# Patient Record
Sex: Male | Born: 1970 | Race: White | Hispanic: No | Marital: Single | Smoking: Former smoker
Health system: Southern US, Community
[De-identification: ages and names within clinical notes are randomized; demographics above are authoritative.]

## PROBLEM LIST (undated history)

## (undated) DIAGNOSIS — R519 Headache, unspecified: Secondary | ICD-10-CM

## (undated) DIAGNOSIS — E109 Type 1 diabetes mellitus without complications: Secondary | ICD-10-CM

## (undated) DIAGNOSIS — I10 Essential (primary) hypertension: Secondary | ICD-10-CM

## (undated) DIAGNOSIS — I2510 Atherosclerotic heart disease of native coronary artery without angina pectoris: Secondary | ICD-10-CM

## (undated) DIAGNOSIS — E119 Type 2 diabetes mellitus without complications: Secondary | ICD-10-CM

## (undated) DIAGNOSIS — N183 Chronic kidney disease, stage 3 unspecified: Secondary | ICD-10-CM

## (undated) DIAGNOSIS — W3400XA Accidental discharge from unspecified firearms or gun, initial encounter: Secondary | ICD-10-CM

## (undated) DIAGNOSIS — R51 Headache: Secondary | ICD-10-CM

## (undated) DIAGNOSIS — E1110 Type 2 diabetes mellitus with ketoacidosis without coma: Secondary | ICD-10-CM

## (undated) DIAGNOSIS — F603 Borderline personality disorder: Secondary | ICD-10-CM

## (undated) DIAGNOSIS — F4310 Post-traumatic stress disorder, unspecified: Secondary | ICD-10-CM

## (undated) HISTORY — DX: Type 1 diabetes mellitus without complications: E10.9

## (undated) HISTORY — PX: ANKLE SURGERY: SHX546

---

## 1998-09-30 ENCOUNTER — Emergency Department (HOSPITAL_COMMUNITY): Admission: EM | Admit: 1998-09-30 | Discharge: 1998-09-30 | Payer: Self-pay | Admitting: Emergency Medicine

## 2010-01-30 ENCOUNTER — Inpatient Hospital Stay (HOSPITAL_COMMUNITY): Admission: EM | Admit: 2010-01-30 | Discharge: 2010-02-04 | Payer: Self-pay | Admitting: Emergency Medicine

## 2010-09-02 LAB — CBC
HCT: 31.5 % — ABNORMAL LOW (ref 39.0–52.0)
HCT: 31.9 % — ABNORMAL LOW (ref 39.0–52.0)
MCHC: 34.7 g/dL (ref 30.0–36.0)
MCV: 92.4 fL (ref 78.0–100.0)
MCV: 90.6 fL (ref 78.0–100.0)
Platelets: 220 10*3/uL (ref 150–400)
Platelets: 233 10*3/uL (ref 150–400)
RBC: 3.42 MIL/uL — ABNORMAL LOW (ref 4.22–5.81)
RDW: 15.3 % (ref 11.5–15.5)
RDW: 13.5 % (ref 11.5–15.5)
WBC: 5.6 10*3/uL (ref 4.0–10.5)
WBC: 9.1 10*3/uL (ref 4.0–10.5)

## 2010-09-02 LAB — POCT I-STAT, CHEM 8
BUN: 21 mg/dL (ref 6–23)
Chloride: 105 mEq/L (ref 96–112)
Creatinine, Ser: 1.5 mg/dL (ref 0.4–1.5)
Glucose, Bld: 105 mg/dL — ABNORMAL HIGH (ref 70–99)
Hemoglobin: 10.9 g/dL — ABNORMAL LOW (ref 13.0–17.0)
Potassium: 3.1 mEq/L — ABNORMAL LOW (ref 3.5–5.1)
Sodium: 141 mEq/L (ref 135–145)

## 2010-09-02 LAB — GLUCOSE, CAPILLARY
Glucose-Capillary: 133 mg/dL — ABNORMAL HIGH (ref 70–99)
Glucose-Capillary: 148 mg/dL — ABNORMAL HIGH (ref 70–99)
Glucose-Capillary: 152 mg/dL — ABNORMAL HIGH (ref 70–99)
Glucose-Capillary: 206 mg/dL — ABNORMAL HIGH (ref 70–99)
Glucose-Capillary: 218 mg/dL — ABNORMAL HIGH (ref 70–99)
Glucose-Capillary: 284 mg/dL — ABNORMAL HIGH (ref 70–99)
Glucose-Capillary: 329 mg/dL — ABNORMAL HIGH (ref 70–99)
Glucose-Capillary: 343 mg/dL — ABNORMAL HIGH (ref 70–99)
Glucose-Capillary: 350 mg/dL — ABNORMAL HIGH (ref 70–99)
Glucose-Capillary: 419 mg/dL — ABNORMAL HIGH (ref 70–99)
Glucose-Capillary: 102 mg/dL — ABNORMAL HIGH (ref 70–99)
Glucose-Capillary: 135 mg/dL — ABNORMAL HIGH (ref 70–99)
Glucose-Capillary: 145 mg/dL — ABNORMAL HIGH (ref 70–99)
Glucose-Capillary: 150 mg/dL — ABNORMAL HIGH (ref 70–99)
Glucose-Capillary: 158 mg/dL — ABNORMAL HIGH (ref 70–99)
Glucose-Capillary: 179 mg/dL — ABNORMAL HIGH (ref 70–99)
Glucose-Capillary: 254 mg/dL — ABNORMAL HIGH (ref 70–99)
Glucose-Capillary: 317 mg/dL — ABNORMAL HIGH (ref 70–99)
Glucose-Capillary: 345 mg/dL — ABNORMAL HIGH (ref 70–99)
Glucose-Capillary: 446 mg/dL — ABNORMAL HIGH (ref 70–99)
Glucose-Capillary: 56 mg/dL — ABNORMAL LOW (ref 70–99)
Glucose-Capillary: 61 mg/dL — ABNORMAL LOW (ref 70–99)
Glucose-Capillary: 64 mg/dL — ABNORMAL LOW (ref 70–99)
Glucose-Capillary: 75 mg/dL (ref 70–99)
Glucose-Capillary: 87 mg/dL (ref 70–99)

## 2010-09-02 LAB — BASIC METABOLIC PANEL
BUN: 18 mg/dL (ref 6–23)
BUN: 30 mg/dL — ABNORMAL HIGH (ref 6–23)
CO2: 27 mEq/L (ref 19–32)
CO2: 27 mEq/L (ref 19–32)
CO2: 28 mEq/L (ref 19–32)
Calcium: 9.5 mg/dL (ref 8.4–10.5)
Calcium: 9.5 mg/dL (ref 8.4–10.5)
Calcium: 9.7 mg/dL (ref 8.4–10.5)
Chloride: 107 mEq/L (ref 96–112)
Creatinine, Ser: 1.27 mg/dL (ref 0.4–1.5)
GFR calc Af Amer: >60 mL/min (ref >60)
GFR calc Af Amer: >60 mL/min (ref >60)
GFR calc Af Amer: >60 mL/min (ref >60)
GFR calc Af Amer: >60 mL/min (ref >60)
GFR calc non Af Amer: >60 mL/min (ref >60)
GFR calc non Af Amer: >60 mL/min (ref >60)
GFR calc non Af Amer: >60 mL/min (ref >60)
Glucose, Bld: 176 mg/dL — ABNORMAL HIGH (ref 70–99)
Glucose, Bld: 285 mg/dL — ABNORMAL HIGH (ref 70–99)
Potassium: 4.1 mEq/L (ref 3.5–5.1)
Potassium: 4.2 mEq/L (ref 3.5–5.1)
Potassium: 4.3 mEq/L (ref 3.5–5.1)
Sodium: 136 mEq/L (ref 135–145)
Sodium: 137 mEq/L (ref 135–145)
Sodium: 139 mEq/L (ref 135–145)

## 2010-09-02 LAB — DIFFERENTIAL
Basophils Absolute: 0 10*3/uL (ref 0.0–0.1)
Basophils Relative: 0 % (ref 0–1)
Eosinophils Absolute: 0 10*3/uL (ref 0.0–0.7)
Eosinophils Relative: 1 % (ref 0–5)
Lymphocytes Relative: 10 % — ABNORMAL LOW (ref 12–46)
Monocytes Absolute: 0.3 10*3/uL (ref 0.1–1.0)

## 2010-09-02 LAB — URINALYSIS, ROUTINE W REFLEX MICROSCOPIC
Bilirubin Urine: NEGATIVE
Glucose, UA: 250 mg/dL — AB
Ketones, ur: NEGATIVE mg/dL
Nitrite: NEGATIVE
Specific Gravity, Urine: 1.012 (ref 1.005–1.030)
pH: 6 (ref 5.0–8.0)

## 2010-09-02 LAB — CK TOTAL AND CKMB (NOT AT ARMC): Total CK: 148 U/L (ref 7–232)

## 2012-09-18 ENCOUNTER — Emergency Department (HOSPITAL_COMMUNITY)
Admission: EM | Admit: 2012-09-18 | Discharge: 2012-09-18 | Disposition: A | Discharge status: Home / Self Care | Payer: Medicare Other | Attending: Emergency Medicine | Admitting: Emergency Medicine

## 2012-09-18 ENCOUNTER — Encounter (HOSPITAL_COMMUNITY): Payer: Self-pay | Admitting: *Deleted

## 2012-09-18 DIAGNOSIS — K089 Disorder of teeth and supporting structures, unspecified: Secondary | ICD-10-CM | POA: Insufficient documentation

## 2012-09-18 DIAGNOSIS — Z87891 Personal history of nicotine dependence: Secondary | ICD-10-CM | POA: Insufficient documentation

## 2012-09-18 DIAGNOSIS — Z794 Long term (current) use of insulin: Secondary | ICD-10-CM | POA: Insufficient documentation

## 2012-09-18 DIAGNOSIS — R51 Headache: Secondary | ICD-10-CM | POA: Insufficient documentation

## 2012-09-18 DIAGNOSIS — I252 Old myocardial infarction: Secondary | ICD-10-CM | POA: Insufficient documentation

## 2012-09-18 DIAGNOSIS — E1110 Type 2 diabetes mellitus with ketoacidosis without coma: Secondary | ICD-10-CM | POA: Insufficient documentation

## 2012-09-18 DIAGNOSIS — R079 Chest pain, unspecified: Secondary | ICD-10-CM | POA: Insufficient documentation

## 2012-09-18 DIAGNOSIS — Z87828 Personal history of other (healed) physical injury and trauma: Secondary | ICD-10-CM | POA: Insufficient documentation

## 2012-09-18 DIAGNOSIS — Z9861 Coronary angioplasty status: Secondary | ICD-10-CM | POA: Insufficient documentation

## 2012-09-18 DIAGNOSIS — Z79899 Other long term (current) drug therapy: Secondary | ICD-10-CM | POA: Insufficient documentation

## 2012-09-18 DIAGNOSIS — I10 Essential (primary) hypertension: Secondary | ICD-10-CM | POA: Insufficient documentation

## 2012-09-18 DIAGNOSIS — K0889 Other specified disorders of teeth and supporting structures: Secondary | ICD-10-CM

## 2012-09-18 LAB — GLUCOSE, CAPILLARY: Glucose-Capillary: 171 mg/dL — ABNORMAL HIGH (ref 70–99)

## 2012-09-18 MED ORDER — IBUPROFEN 400 MG PO TABS
600.0000 mg | ORAL_TABLET | Freq: Once | ORAL | Status: AC
  Administered 2012-09-18: 600 mg via ORAL
  Filled 2012-09-18: qty 2
  Filled 2012-09-18: qty 1

## 2012-09-18 MED ORDER — OXYCODONE-ACETAMINOPHEN 5-325 MG PO TABS: 1.0000 | ORAL_TABLET | ORAL | Status: DC | PRN

## 2012-09-18 MED ORDER — BUPIVACAINE HCL 0.5 % IJ SOLN: 50.0000 mL | Freq: Once | INTRAMUSCULAR | Status: DC

## 2012-09-18 MED ORDER — OXYCODONE-ACETAMINOPHEN 5-325 MG PO TABS
2.0000 | ORAL_TABLET | Freq: Once | ORAL | Status: AC
  Administered 2012-09-18: 2 via ORAL
  Filled 2012-09-18: qty 2

## 2012-09-18 MED ORDER — BUPIVACAINE HCL (PF) 0.5 % IJ SOLN
10.0000 mL | Freq: Once | INTRAMUSCULAR | Status: DC
  Filled 2012-09-18: qty 30

## 2012-09-18 MED ORDER — IBUPROFEN 600 MG PO TABS: 600.0000 mg | ORAL_TABLET | Freq: Four times a day (QID) | ORAL | Status: DC | PRN

## 2012-09-18 NOTE — ED Notes (Signed)
Patient's blood sugar rechecked. Patient now c/o severe headache, nausea, and chest pain. 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 Charge nurse made aware. EKG done.

## 2012-09-18 NOTE — ED Notes (Signed)
Pt with left dental pain since Friday and is unable to see dentist anytime soon, states CBG's in high 300's today and has an insulin pump, hx of MI as well per pt

## 2012-09-24 NOTE — ED Provider Notes (Signed)
History    41yM with dental pain. Gradual onset Friday. Constant. Stable since onset. No drainable. No fever or chills. No difficulty breathing or swallowing. Has been taking nsaids with only mild relief. Reports has dental follow-up.   CSN: [id number]  Arrival date & time 09/18/12  1532   First MD Initiated Contact with Patient 09/18/12 1654      Chief Complaint  Patient presents with  . Dental Pain  . Chest Pain  . Headache    (Consider location/radiation/quality/duration/timing/severity/associated sxs/prior treatment) HPI  Past Medical History  Diagnosis Date  . MI, old   . Fx ankle   . Diabetes mellitus without complication   . Hypertension   . DKA (diabetic ketoacidoses)     Past Surgical History  Procedure Laterality Date  . Coronary angioplasty with stent placement      History reviewed. No pertinent family history.  History  Substance Use Topics  . Smoking status: Former Games developer  . Smokeless tobacco: Not on file  . Alcohol Use: Yes     Comment: occasional use      Review of Systems  All systems reviewed and negative, other than as noted in HPI.   Allergies  Review of patient's allergies indicates no known allergies.  Home Medications   Current Outpatient Rx  Name  Route  Sig  Dispense  Refill  . acetaminophen (TYLENOL) 500 MG tablet   Oral   Take 1,000 mg by mouth once as needed for pain.         Marland Kitchen amLODipine (NORVASC) 10 MG tablet   Oral   Take 10 mg by mouth daily.         . cholecalciferol (VITAMIN D) 1000 UNITS tablet   Oral   Take 1,000 Units by mouth daily.         . fish oil-omega-3 fatty acids 1000 MG capsule   Oral   Take 2 g by mouth 2 (two) times daily.         . hydroxypropyl methylcellulose (ISOPTO TEARS) 2.5 % ophthalmic solution   Both Eyes   Place 1 drop into both eyes 4 (four) times daily.         . insulin aspart (NOVOLOG) 100 UNIT/ML injection   Subcutaneous   Inject into the skin continuous. Decarb  Ratio= 13grams:1unit  Insulin Sensitivity=38:1unit Basal= 0.775/hr         . lisinopril (PRINIVIL,ZESTRIL) 40 MG tablet   Oral   Take 40 mg by mouth daily.         . Magnesium Oxide 420 MG TABS   Oral   Take 1 tablet by mouth daily.         . metoprolol (LOPRESSOR) 50 MG tablet   Oral   Take 25 mg by mouth 2 (two) times daily.         Marland Kitchen PARoxetine (PAXIL) 40 MG tablet   Oral   Take 60 mg by mouth every morning.         . simvastatin (ZOCOR) 20 MG tablet   Oral   Take 10 mg by mouth every evening.         . traZODone (DESYREL) 100 MG tablet   Oral   Take 100 mg by mouth at bedtime as needed for sleep.         Marland Kitchen zolpidem (AMBIEN) 10 MG tablet   Oral   Take 10 mg by mouth at bedtime.         Marland Kitchen ibuprofen (ADVIL,MOTRIN)  600 MG tablet   Oral   Take 1 tablet (600 mg total) by mouth every 6 (six) hours as needed for pain.   30 tablet   30   . oxyCODONE-acetaminophen (PERCOCET/ROXICET) 5-325 MG per tablet   Oral   Take 1-2 tablets by mouth every 4 (four) hours as needed for pain.   20 tablet   0     BP 170/92  Pulse 73  Temp(Src) 98 F (36.7 C) (Oral)  Resp 18  Ht 5\' 6"  (1.676 m)  Wt 140 lb (63.504 kg)  BMI 22.61 kg/m2  SpO2 100%  Physical Exam  Nursing note and vitals reviewed. Constitutional: He appears well-developed and well-nourished. No distress.  HENT:  Head: Normocephalic and atraumatic.  Evidence of previous dental work to L upper second molar. No significant gingival changes. No drainable collection. Posterior pharynx clear. uvula midline. Handling secretions. Normal sounding phonation. Neck supple. No adenopathy.   Eyes: Conjunctivae are normal. Right eye exhibits no discharge. Left eye exhibits no discharge.  Neck: Neck supple.  Cardiovascular: Normal rate, regular rhythm and normal heart sounds.  Exam reveals no gallop and no friction rub.   No murmur heard. Pulmonary/Chest: Effort normal and breath sounds normal. No respiratory  distress.  Abdominal: Soft. He exhibits no distension. There is no tenderness.  Musculoskeletal: He exhibits no edema and no tenderness.  Neurological: He is alert.  Skin: Skin is warm and dry.  Psychiatric: He has a normal mood and affect. His behavior is normal. Thought content normal.    ED Course  NERVE BLOCK Date/Time: 09/18/2012 3:29 PM Performed by: Raeford Razor Authorized by: Raeford Razor Consent: Verbal consent obtained. Risks and benefits: risks, benefits and alternatives were discussed Patient identity confirmed: verbally with patient, arm band and provided demographic data Indications: pain relief Body area: face/mouth Laterality: left Patient sedated: no Patient position: sitting Needle gauge: 27 G Location technique: anatomical landmarks Local anesthetic: bupivacaine 0.5% without epinephrine Anesthetic total: 1.5 ml Outcome: pain improved Patient tolerance: Patient tolerated the procedure well with no immediate complications. Comments: L upper molar injected at apex    (including critical care time)  Labs Reviewed  GLUCOSE, CAPILLARY - Abnormal; Notable for the following:    Glucose-Capillary 171 (*)    All other components within normal limits   No results found.   1. Pain, dental       MDM  41yM with dental pain. No drainable collection or evidence of serious deep space head/neck infection. Dental block with good resultant analgesia. Outpt dental FU.         Raeford Razor, MD 09/24/12 [phone]

## 2012-12-28 ENCOUNTER — Emergency Department (HOSPITAL_COMMUNITY)
Admission: EM | Admit: 2012-12-28 | Discharge: 2012-12-28 | Disposition: A | Discharge status: Home / Self Care | Payer: Non-veteran care | Attending: Emergency Medicine | Admitting: Emergency Medicine

## 2012-12-28 ENCOUNTER — Encounter (HOSPITAL_COMMUNITY): Payer: Self-pay | Admitting: Emergency Medicine

## 2012-12-28 DIAGNOSIS — M79609 Pain in unspecified limb: Secondary | ICD-10-CM | POA: Insufficient documentation

## 2012-12-28 DIAGNOSIS — E1110 Type 2 diabetes mellitus with ketoacidosis without coma: Secondary | ICD-10-CM | POA: Insufficient documentation

## 2012-12-28 DIAGNOSIS — I252 Old myocardial infarction: Secondary | ICD-10-CM | POA: Insufficient documentation

## 2012-12-28 DIAGNOSIS — M25579 Pain in unspecified ankle and joints of unspecified foot: Secondary | ICD-10-CM | POA: Insufficient documentation

## 2012-12-28 DIAGNOSIS — Z87891 Personal history of nicotine dependence: Secondary | ICD-10-CM | POA: Insufficient documentation

## 2012-12-28 DIAGNOSIS — Z8781 Personal history of (healed) traumatic fracture: Secondary | ICD-10-CM | POA: Insufficient documentation

## 2012-12-28 DIAGNOSIS — Z9861 Coronary angioplasty status: Secondary | ICD-10-CM | POA: Insufficient documentation

## 2012-12-28 DIAGNOSIS — I10 Essential (primary) hypertension: Secondary | ICD-10-CM | POA: Insufficient documentation

## 2012-12-28 DIAGNOSIS — Z79899 Other long term (current) drug therapy: Secondary | ICD-10-CM | POA: Insufficient documentation

## 2012-12-28 DIAGNOSIS — Z794 Long term (current) use of insulin: Secondary | ICD-10-CM | POA: Insufficient documentation

## 2012-12-28 DIAGNOSIS — M25572 Pain in left ankle and joints of left foot: Secondary | ICD-10-CM

## 2012-12-28 DIAGNOSIS — M79672 Pain in left foot: Secondary | ICD-10-CM

## 2012-12-28 DIAGNOSIS — E162 Hypoglycemia, unspecified: Secondary | ICD-10-CM

## 2012-12-28 DIAGNOSIS — E1169 Type 2 diabetes mellitus with other specified complication: Secondary | ICD-10-CM | POA: Insufficient documentation

## 2012-12-28 HISTORY — DX: Accidental discharge from unspecified firearms or gun, initial encounter: W34.00XA

## 2012-12-28 LAB — GLUCOSE, CAPILLARY
Glucose-Capillary: 32 mg/dL — CL (ref 70–99)
Glucose-Capillary: 189 mg/dL — ABNORMAL HIGH (ref 70–99)

## 2012-12-28 MED ORDER — DEXTROSE 50 % IV SOLN
INTRAVENOUS | Status: AC
  Filled 2012-12-28: qty 50

## 2012-12-28 MED ORDER — NAPROXEN 500 MG PO TABS: 500.0000 mg | ORAL_TABLET | Freq: Two times a day (BID) | ORAL | Status: DC

## 2012-12-28 MED ORDER — NAPROXEN 250 MG PO TABS
500.0000 mg | ORAL_TABLET | Freq: Once | ORAL | Status: AC
  Administered 2012-12-28: 500 mg via ORAL
  Filled 2012-12-28: qty 2

## 2012-12-28 MED ORDER — DEXTROSE 50 % IV SOLN
1.0000 | Freq: Once | INTRAVENOUS | Status: AC
  Administered 2012-12-28: 50 mL via INTRAVENOUS

## 2012-12-28 NOTE — ED Notes (Signed)
Patient complaining of sever left ankle and left foot pain x 2 days. Reports no relief with Motrin. Denies injury.

## 2012-12-28 NOTE — ED Notes (Signed)
Blood glucose checked after administration of D50. CBG read "189". Patient reports he feels better at this time. Patient alert and oriented with clear speech. Dr. Colon Branch notified. Patient drinking soda and eating peanut butter crackers at this time.

## 2012-12-28 NOTE — ED Notes (Signed)
Dr. Colon Branch notified of patient's repeat blood glucose.

## 2012-12-28 NOTE — ED Notes (Signed)
Patient called out on call bell and stated that he felt as if his blood sugar was dropping and wanted his blood sugar checked. Patient here with only complaint of ankle/foot pain. Patient has history of DM with insulin pump. Checked patient's CBG and blood glucose read "32". Patient then began slurring words and was diaphoretic. Dr. Colon Branch notified. Patient drank 2 cups of orange juice. IV then started and D50 administered. Shortly after administration of D50, patient began acting normal self again and stated he felt better.
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
## 2012-12-28 NOTE — ED Notes (Signed)
Patient discharged home with instructions to follow up with MD regarding hypoglycemia. Patient verbalized understanding. Patient also instructed to check blood glucose prior to going to sleep once he gets home. No acute distress noted on discharge.

## 2013-01-15 DIAGNOSIS — R079 Chest pain, unspecified: Secondary | ICD-10-CM

## 2013-05-13 ENCOUNTER — Emergency Department (HOSPITAL_COMMUNITY)
Admission: EM | Admit: 2013-05-13 | Discharge: 2013-05-14 | Disposition: A | Discharge status: Home / Self Care | Payer: Medicare Other | Attending: Emergency Medicine | Admitting: Emergency Medicine

## 2013-05-13 ENCOUNTER — Encounter (HOSPITAL_COMMUNITY): Payer: Self-pay | Admitting: Emergency Medicine

## 2013-05-13 DIAGNOSIS — I10 Essential (primary) hypertension: Secondary | ICD-10-CM | POA: Insufficient documentation

## 2013-05-13 DIAGNOSIS — Z794 Long term (current) use of insulin: Secondary | ICD-10-CM | POA: Insufficient documentation

## 2013-05-13 DIAGNOSIS — K529 Noninfective gastroenteritis and colitis, unspecified: Secondary | ICD-10-CM

## 2013-05-13 DIAGNOSIS — K5289 Other specified noninfective gastroenteritis and colitis: Secondary | ICD-10-CM | POA: Insufficient documentation

## 2013-05-13 DIAGNOSIS — I252 Old myocardial infarction: Secondary | ICD-10-CM | POA: Insufficient documentation

## 2013-05-13 DIAGNOSIS — E119 Type 2 diabetes mellitus without complications: Secondary | ICD-10-CM | POA: Insufficient documentation

## 2013-05-13 DIAGNOSIS — Z79899 Other long term (current) drug therapy: Secondary | ICD-10-CM | POA: Insufficient documentation

## 2013-05-13 DIAGNOSIS — R51 Headache: Secondary | ICD-10-CM | POA: Insufficient documentation

## 2013-05-13 DIAGNOSIS — Z87891 Personal history of nicotine dependence: Secondary | ICD-10-CM | POA: Insufficient documentation

## 2013-05-13 DIAGNOSIS — Z9861 Coronary angioplasty status: Secondary | ICD-10-CM | POA: Insufficient documentation

## 2013-05-13 DIAGNOSIS — Z87828 Personal history of other (healed) physical injury and trauma: Secondary | ICD-10-CM | POA: Insufficient documentation

## 2013-05-13 DIAGNOSIS — Z8781 Personal history of (healed) traumatic fracture: Secondary | ICD-10-CM | POA: Insufficient documentation

## 2013-05-13 LAB — CBC WITH DIFFERENTIAL/PLATELET
Basophils Absolute: 0.1 10*3/uL (ref 0.0–0.1)
Basophils Relative: 1 % (ref 0–1)
Eosinophils Absolute: 0 10*3/uL (ref 0.0–0.7)
Hemoglobin: 14.6 g/dL (ref 13.0–17.0)
MCH: 33.3 pg (ref 26.0–34.0)
MCHC: 36.1 g/dL — ABNORMAL HIGH (ref 30.0–36.0)
Neutro Abs: 6.4 10*3/uL (ref 1.7–7.7)
Neutrophils Relative %: 75 % (ref 43–77)
Platelets: 216 10*3/uL (ref 150–400)
RDW: 12.1 % (ref 11.5–15.5)

## 2013-05-13 LAB — TROPONIN I: Troponin I: <0.3 ng/mL (ref <0.30)

## 2013-05-13 LAB — BASIC METABOLIC PANEL
BUN: 24 mg/dL — ABNORMAL HIGH (ref 6–23)
Calcium: 9.6 mg/dL (ref 8.4–10.5)
Creatinine, Ser: 1.64 mg/dL — ABNORMAL HIGH (ref 0.50–1.35)
GFR calc Af Amer: 58 mL/min — ABNORMAL LOW (ref >90)
GFR calc non Af Amer: 50 mL/min — ABNORMAL LOW (ref >90)
Potassium: 3.7 mEq/L (ref 3.5–5.1)

## 2013-05-13 LAB — GLUCOSE, CAPILLARY: Glucose-Capillary: 191 mg/dL — ABNORMAL HIGH (ref 70–99)

## 2013-05-13 MED ORDER — SODIUM CHLORIDE 0.9 % IV BOLUS (SEPSIS)
1000.0000 mL | Freq: Once | INTRAVENOUS | Status: AC
  Administered 2013-05-13: 1000 mL via INTRAVENOUS

## 2013-05-13 MED ORDER — ONDANSETRON HCL 4 MG/2ML IJ SOLN
4.0000 mg | Freq: Once | INTRAMUSCULAR | Status: AC
  Administered 2013-05-13: 4 mg via INTRAVENOUS
  Filled 2013-05-13: qty 2

## 2013-05-13 MED ORDER — KETOROLAC TROMETHAMINE 30 MG/ML IJ SOLN
30.0000 mg | Freq: Once | INTRAMUSCULAR | Status: AC
  Administered 2013-05-14: 30 mg via INTRAVENOUS
  Filled 2013-05-13: qty 1

## 2013-05-13 MED ORDER — SODIUM CHLORIDE 0.9 % IV BOLUS (SEPSIS)
1000.0000 mL | Freq: Once | INTRAVENOUS | Status: AC
  Administered 2013-05-14: 1000 mL via INTRAVENOUS

## 2013-05-13 MED ORDER — MORPHINE SULFATE 4 MG/ML IJ SOLN
4.0000 mg | Freq: Once | INTRAMUSCULAR | Status: AC
  Administered 2013-05-13: 4 mg via INTRAVENOUS
  Filled 2013-05-13: qty 1

## 2013-05-13 NOTE — ED Notes (Signed)
Offered patient toradol for pain. Patient states "I feel ok right now so can we hold off for a little bit and I will let you know if I need it."

## 2013-05-13 NOTE — ED Notes (Addendum)
Patient complaining of vomiting, diarrhea, and headache since yesterday. Also states he is on an insulin pump and his glucose is 243 at home. States he has not been able to keep down his medications including b/p meds.
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
## 2013-05-14 LAB — URINALYSIS, ROUTINE W REFLEX MICROSCOPIC
Nitrite: NEGATIVE
Protein, ur: 100 mg/dL — AB
Specific Gravity, Urine: 1.025 (ref 1.005–1.030)
Urobilinogen, UA: 0.2 mg/dL (ref 0.0–1.0)

## 2013-05-14 LAB — URINE MICROSCOPIC-ADD ON

## 2013-05-14 MED ORDER — PROMETHAZINE HCL 25 MG PO TABS: 25.0000 mg | ORAL_TABLET | Freq: Four times a day (QID) | ORAL | Status: DC | PRN

## 2013-05-14 MED ORDER — OXYCODONE HCL 5 MG PO TABS: 5.0000 mg | ORAL_TABLET | ORAL | Status: DC | PRN

## 2013-05-21 ENCOUNTER — Encounter (HOSPITAL_COMMUNITY): Payer: Self-pay | Admitting: Emergency Medicine

## 2013-05-21 ENCOUNTER — Emergency Department (HOSPITAL_COMMUNITY): Payer: Non-veteran care

## 2013-05-21 ENCOUNTER — Inpatient Hospital Stay (HOSPITAL_COMMUNITY)
Admission: EM | Admit: 2013-05-21 | Discharge: 2013-05-23 | DRG: 074 | Disposition: A | Discharge status: Home / Self Care | Payer: Non-veteran care | Attending: Internal Medicine | Admitting: Internal Medicine

## 2013-05-21 DIAGNOSIS — J351 Hypertrophy of tonsils: Secondary | ICD-10-CM | POA: Diagnosis present

## 2013-05-21 DIAGNOSIS — I252 Old myocardial infarction: Secondary | ICD-10-CM

## 2013-05-21 DIAGNOSIS — IMO0001 Reserved for inherently not codable concepts without codable children: Secondary | ICD-10-CM

## 2013-05-21 DIAGNOSIS — N179 Acute kidney failure, unspecified: Secondary | ICD-10-CM

## 2013-05-21 DIAGNOSIS — E1049 Type 1 diabetes mellitus with other diabetic neurological complication: Principal | ICD-10-CM | POA: Diagnosis present

## 2013-05-21 DIAGNOSIS — Z8249 Family history of ischemic heart disease and other diseases of the circulatory system: Secondary | ICD-10-CM

## 2013-05-21 DIAGNOSIS — Z9861 Coronary angioplasty status: Secondary | ICD-10-CM

## 2013-05-21 DIAGNOSIS — I10 Essential (primary) hypertension: Secondary | ICD-10-CM | POA: Diagnosis present

## 2013-05-21 DIAGNOSIS — E1065 Type 1 diabetes mellitus with hyperglycemia: Principal | ICD-10-CM | POA: Diagnosis present

## 2013-05-21 DIAGNOSIS — R519 Headache, unspecified: Secondary | ICD-10-CM

## 2013-05-21 DIAGNOSIS — E1165 Type 2 diabetes mellitus with hyperglycemia: Secondary | ICD-10-CM

## 2013-05-21 DIAGNOSIS — I2510 Atherosclerotic heart disease of native coronary artery without angina pectoris: Secondary | ICD-10-CM

## 2013-05-21 DIAGNOSIS — Z794 Long term (current) use of insulin: Secondary | ICD-10-CM

## 2013-05-21 DIAGNOSIS — R112 Nausea with vomiting, unspecified: Secondary | ICD-10-CM

## 2013-05-21 DIAGNOSIS — IMO0002 Reserved for concepts with insufficient information to code with codable children: Secondary | ICD-10-CM

## 2013-05-21 DIAGNOSIS — Z87891 Personal history of nicotine dependence: Secondary | ICD-10-CM

## 2013-05-21 DIAGNOSIS — R51 Headache: Secondary | ICD-10-CM

## 2013-05-21 DIAGNOSIS — E860 Dehydration: Secondary | ICD-10-CM | POA: Diagnosis present

## 2013-05-21 DIAGNOSIS — I129 Hypertensive chronic kidney disease with stage 1 through stage 4 chronic kidney disease, or unspecified chronic kidney disease: Secondary | ICD-10-CM | POA: Diagnosis present

## 2013-05-21 DIAGNOSIS — H0520 Unspecified exophthalmos: Secondary | ICD-10-CM | POA: Diagnosis present

## 2013-05-21 DIAGNOSIS — R739 Hyperglycemia, unspecified: Secondary | ICD-10-CM

## 2013-05-21 DIAGNOSIS — K3184 Gastroparesis: Secondary | ICD-10-CM | POA: Diagnosis present

## 2013-05-21 DIAGNOSIS — G44009 Cluster headache syndrome, unspecified, not intractable: Secondary | ICD-10-CM | POA: Diagnosis present

## 2013-05-21 DIAGNOSIS — Z9641 Presence of insulin pump (external) (internal): Secondary | ICD-10-CM

## 2013-05-21 DIAGNOSIS — F603 Borderline personality disorder: Secondary | ICD-10-CM | POA: Diagnosis present

## 2013-05-21 DIAGNOSIS — N182 Chronic kidney disease, stage 2 (mild): Secondary | ICD-10-CM | POA: Diagnosis present

## 2013-05-21 DIAGNOSIS — F4310 Post-traumatic stress disorder, unspecified: Secondary | ICD-10-CM | POA: Diagnosis present

## 2013-05-21 HISTORY — DX: Headache, unspecified: R51.9

## 2013-05-21 HISTORY — DX: Borderline personality disorder: F60.3

## 2013-05-21 HISTORY — DX: Headache: R51

## 2013-05-21 HISTORY — DX: Type 2 diabetes mellitus with ketoacidosis without coma: E11.10

## 2013-05-21 HISTORY — DX: Post-traumatic stress disorder, unspecified: F43.10

## 2013-05-21 LAB — GLUCOSE, CAPILLARY
Glucose-Capillary: 421 mg/dL — ABNORMAL HIGH (ref 70–99)
Glucose-Capillary: 358 mg/dL — ABNORMAL HIGH (ref 70–99)
Glucose-Capillary: 331 mg/dL — ABNORMAL HIGH (ref 70–99)

## 2013-05-21 LAB — CBC WITH DIFFERENTIAL/PLATELET
HCT: 39.6 % (ref 39.0–52.0)
Hemoglobin: 13.9 g/dL (ref 13.0–17.0)
Lymphocytes Relative: 14 % (ref 12–46)
Lymphs Abs: 1 10*3/uL (ref 0.7–4.0)
MCH: 33 pg (ref 26.0–34.0)
Monocytes Absolute: 0.4 10*3/uL (ref 0.1–1.0)
Monocytes Relative: 6 % (ref 3–12)
Neutro Abs: 5.8 10*3/uL (ref 1.7–7.7)
Neutrophils Relative %: 80 % — ABNORMAL HIGH (ref 43–77)
RBC: 4.21 MIL/uL — ABNORMAL LOW (ref 4.22–5.81)

## 2013-05-21 LAB — BLOOD GAS, ARTERIAL
O2 Saturation: 93.8 %
Patient temperature: 37
TCO2: 22.3 mmol/L (ref 0–100)
pH, Arterial: 7.453 — ABNORMAL HIGH (ref 7.350–7.450)

## 2013-05-21 LAB — COMPREHENSIVE METABOLIC PANEL
Alkaline Phosphatase: 125 U/L — ABNORMAL HIGH (ref 39–117)
BUN: 26 mg/dL — ABNORMAL HIGH (ref 6–23)
Chloride: 94 mEq/L — ABNORMAL LOW (ref 96–112)
Creatinine, Ser: 1.83 mg/dL — ABNORMAL HIGH (ref 0.50–1.35)
GFR calc Af Amer: 51 mL/min — ABNORMAL LOW (ref >90)
GFR calc non Af Amer: 44 mL/min — ABNORMAL LOW (ref >90)
Glucose, Bld: 436 mg/dL — ABNORMAL HIGH (ref 70–99)
Potassium: 4.6 mEq/L (ref 3.5–5.1)
Total Bilirubin: 0.7 mg/dL (ref 0.3–1.2)

## 2013-05-21 LAB — TROPONIN I
Troponin I: <0.3 ng/mL (ref <0.30)
Troponin I: <0.3 ng/mL (ref <0.30)

## 2013-05-21 LAB — URINALYSIS, ROUTINE W REFLEX MICROSCOPIC
Bilirubin Urine: NEGATIVE
Glucose, UA: >1000 mg/dL — AB
Nitrite: NEGATIVE
Protein, ur: 30 mg/dL — AB
Urobilinogen, UA: 0.2 mg/dL (ref 0.0–1.0)

## 2013-05-21 LAB — BASIC METABOLIC PANEL
BUN: 22 mg/dL (ref 6–23)
CO2: 25 mEq/L (ref 19–32)
Calcium: 9.2 mg/dL (ref 8.4–10.5)
Creatinine, Ser: 1.57 mg/dL — ABNORMAL HIGH (ref 0.50–1.35)
GFR calc non Af Amer: 52 mL/min — ABNORMAL LOW (ref >90)
Glucose, Bld: 356 mg/dL — ABNORMAL HIGH (ref 70–99)
Potassium: 4.2 mEq/L (ref 3.5–5.1)

## 2013-05-21 LAB — LIPASE, BLOOD: Lipase: 19 U/L (ref 11–59)

## 2013-05-21 LAB — URINE MICROSCOPIC-ADD ON

## 2013-05-21 MED ORDER — FENTANYL CITRATE 0.05 MG/ML IJ SOLN
25.0000 ug | INTRAMUSCULAR | Status: AC | PRN
  Administered 2013-05-21 (×2): 25 ug via INTRAVENOUS
  Filled 2013-05-21 (×2): qty 2

## 2013-05-21 MED ORDER — TRIAMCINOLONE ACETONIDE 0.1 % EX CREA
TOPICAL_CREAM | Freq: Three times a day (TID) | CUTANEOUS | Status: DC
  Administered 2013-05-21 – 2013-05-23 (×4): via TOPICAL
  Filled 2013-05-21: qty 15

## 2013-05-21 MED ORDER — SIMVASTATIN 10 MG PO TABS
10.0000 mg | ORAL_TABLET | Freq: Every evening | ORAL | Status: DC
  Administered 2013-05-22: 10 mg via ORAL
  Filled 2013-05-21 (×2): qty 1

## 2013-05-21 MED ORDER — HEPARIN SODIUM (PORCINE) 5000 UNIT/ML IJ SOLN
5000.0000 [IU] | Freq: Three times a day (TID) | INTRAMUSCULAR | Status: DC
  Administered 2013-05-21 – 2013-05-22 (×4): 5000 [IU] via SUBCUTANEOUS
  Filled 2013-05-21 (×4): qty 1

## 2013-05-21 MED ORDER — HYDRALAZINE HCL 20 MG/ML IJ SOLN
10.0000 mg | Freq: Once | INTRAMUSCULAR | Status: AC
  Administered 2013-05-21: 10 mg via INTRAVENOUS
  Filled 2013-05-21: qty 1

## 2013-05-21 MED ORDER — SODIUM CHLORIDE 0.9 % IV SOLN
INTRAVENOUS | Status: DC
  Administered 2013-05-21: 23:00:00 via INTRAVENOUS
  Filled 2013-05-21: qty 1

## 2013-05-21 MED ORDER — PANTOPRAZOLE SODIUM 40 MG IV SOLR
40.0000 mg | INTRAVENOUS | Status: DC
  Administered 2013-05-21 – 2013-05-22 (×2): 40 mg via INTRAVENOUS
  Filled 2013-05-21 (×2): qty 40

## 2013-05-21 MED ORDER — METOCLOPRAMIDE HCL 5 MG/ML IJ SOLN
10.0000 mg | Freq: Once | INTRAMUSCULAR | Status: AC
  Administered 2013-05-21: 10 mg via INTRAVENOUS
  Filled 2013-05-21: qty 2

## 2013-05-21 MED ORDER — TRIAMCINOLONE ACETONIDE 0.1 % EX CREA
TOPICAL_CREAM | CUTANEOUS | Status: AC
  Filled 2013-05-21: qty 15

## 2013-05-21 MED ORDER — METOCLOPRAMIDE HCL 5 MG/ML IJ SOLN
10.0000 mg | Freq: Four times a day (QID) | INTRAMUSCULAR | Status: DC
  Administered 2013-05-21 – 2013-05-22 (×2): 10 mg via INTRAVENOUS
  Filled 2013-05-21 (×2): qty 2

## 2013-05-21 MED ORDER — ONDANSETRON HCL 4 MG PO TABS: 4.0000 mg | ORAL_TABLET | Freq: Four times a day (QID) | ORAL | Status: DC | PRN

## 2013-05-21 MED ORDER — ONDANSETRON HCL 4 MG/2ML IJ SOLN
4.0000 mg | INTRAMUSCULAR | Status: AC | PRN
  Administered 2013-05-21 (×2): 4 mg via INTRAVENOUS
  Filled 2013-05-21 (×2): qty 2

## 2013-05-21 MED ORDER — TRAZODONE HCL 50 MG PO TABS
100.0000 mg | ORAL_TABLET | Freq: Every day | ORAL | Status: DC
  Administered 2013-05-21 – 2013-05-22 (×2): 100 mg via ORAL
  Filled 2013-05-21 (×2): qty 2

## 2013-05-21 MED ORDER — SODIUM CHLORIDE 0.9 % IV SOLN
INTRAVENOUS | Status: DC
  Administered 2013-05-21: 18:00:00 via INTRAVENOUS

## 2013-05-21 MED ORDER — ONDANSETRON HCL 4 MG/2ML IJ SOLN
4.0000 mg | Freq: Four times a day (QID) | INTRAMUSCULAR | Status: DC | PRN
  Administered 2013-05-21: 4 mg via INTRAVENOUS
  Filled 2013-05-21: qty 2

## 2013-05-21 MED ORDER — SODIUM CHLORIDE 0.9 % IV SOLN
INTRAVENOUS | Status: DC
  Administered 2013-05-21 – 2013-05-22 (×2): via INTRAVENOUS

## 2013-05-21 MED ORDER — SODIUM CHLORIDE 0.9 % IV BOLUS (SEPSIS)
2000.0000 mL | Freq: Once | INTRAVENOUS | Status: AC
  Administered 2013-05-21: 2000 mL via INTRAVENOUS

## 2013-05-21 MED ORDER — INSULIN REGULAR BOLUS VIA INFUSION
0.0000 [IU] | Freq: Three times a day (TID) | INTRAVENOUS | Status: DC
  Administered 2013-05-22: 0 [IU] via INTRAVENOUS
  Administered 2013-05-22: 4.6 [IU] via INTRAVENOUS
  Filled 2013-05-21: qty 10

## 2013-05-21 MED ORDER — ACETAMINOPHEN 325 MG PO TABS: 650.0000 mg | ORAL_TABLET | Freq: Four times a day (QID) | ORAL | Status: DC | PRN

## 2013-05-21 MED ORDER — PROMETHAZINE HCL 25 MG/ML IJ SOLN: 12.5000 mg | Freq: Once | INTRAMUSCULAR | Status: DC

## 2013-05-21 MED ORDER — METOPROLOL TARTRATE 25 MG PO TABS
25.0000 mg | ORAL_TABLET | Freq: Two times a day (BID) | ORAL | Status: DC
  Administered 2013-05-21 – 2013-05-23 (×4): 25 mg via ORAL
  Filled 2013-05-21 (×4): qty 1

## 2013-05-21 MED ORDER — DEXTROSE 50 % IV SOLN: 25.0000 mL | INTRAVENOUS | Status: DC | PRN

## 2013-05-21 MED ORDER — HYDRALAZINE HCL 20 MG/ML IJ SOLN: 10.0000 mg | Freq: Four times a day (QID) | INTRAMUSCULAR | Status: DC | PRN

## 2013-05-21 MED ORDER — ACETAMINOPHEN 650 MG RE SUPP: 650.0000 mg | Freq: Four times a day (QID) | RECTAL | Status: DC | PRN

## 2013-05-21 MED ORDER — DIPHENHYDRAMINE HCL 50 MG/ML IJ SOLN
50.0000 mg | Freq: Once | INTRAMUSCULAR | Status: AC
  Administered 2013-05-21: 50 mg via INTRAVENOUS
  Filled 2013-05-21: qty 1

## 2013-05-21 MED ORDER — SODIUM CHLORIDE 0.9 % IJ SOLN
3.0000 mL | Freq: Two times a day (BID) | INTRAMUSCULAR | Status: DC
  Administered 2013-05-22: 3 mL via INTRAVENOUS

## 2013-05-21 MED ORDER — HYDROMORPHONE HCL PF 1 MG/ML IJ SOLN: 0.5000 mg | INTRAMUSCULAR | Status: DC | PRN

## 2013-05-21 MED ORDER — OXYCODONE HCL 5 MG PO TABS
5.0000 mg | ORAL_TABLET | ORAL | Status: DC | PRN
  Administered 2013-05-22 (×3): 5 mg via ORAL
  Filled 2013-05-21 (×3): qty 1

## 2013-05-21 MED ORDER — AMLODIPINE BESYLATE 5 MG PO TABS
10.0000 mg | ORAL_TABLET | Freq: Every day | ORAL | Status: DC
  Administered 2013-05-22 – 2013-05-23 (×2): 10 mg via ORAL
  Filled 2013-05-21 (×2): qty 2

## 2013-05-21 MED ORDER — DEXTROSE-NACL 5-0.45 % IV SOLN: INTRAVENOUS | Status: DC

## 2013-05-21 MED ORDER — FAMOTIDINE IN NACL 20-0.9 MG/50ML-% IV SOLN
20.0000 mg | Freq: Once | INTRAVENOUS | Status: AC
  Administered 2013-05-21: 20 mg via INTRAVENOUS
  Filled 2013-05-21: qty 50

## 2013-05-21 MED ORDER — PAROXETINE HCL 20 MG PO TABS
60.0000 mg | ORAL_TABLET | Freq: Every morning | ORAL | Status: DC
  Administered 2013-05-22 – 2013-05-23 (×2): 60 mg via ORAL
  Filled 2013-05-21: qty 3
  Filled 2013-05-21: qty 2
  Filled 2013-05-21: qty 3
  Filled 2013-05-21: qty 2
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
## 2013-05-21 NOTE — ED Notes (Signed)
MD at the bedside  

## 2013-05-21 NOTE — ED Notes (Signed)
Pt drinking ginger ale, per MD give pt po fluids

## 2013-05-21 NOTE — ED Notes (Signed)
Pt diabetic and c/o " vomiting that started yesterday along with a headache as well. I feel like Im in DKA"
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
## 2013-05-22 DIAGNOSIS — R7309 Other abnormal glucose: Secondary | ICD-10-CM

## 2013-05-22 DIAGNOSIS — N179 Acute kidney failure, unspecified: Secondary | ICD-10-CM

## 2013-05-22 DIAGNOSIS — E860 Dehydration: Secondary | ICD-10-CM

## 2013-05-22 LAB — COMPREHENSIVE METABOLIC PANEL
ALT: 10 U/L (ref 0–53)
Alkaline Phosphatase: 96 U/L (ref 39–117)
BUN: 22 mg/dL (ref 6–23)
Chloride: 102 mEq/L (ref 96–112)
GFR calc Af Amer: 57 mL/min — ABNORMAL LOW (ref >90)
Glucose, Bld: 135 mg/dL — ABNORMAL HIGH (ref 70–99)
Potassium: 3.8 mEq/L (ref 3.5–5.1)
Sodium: 138 mEq/L (ref 135–145)
Total Bilirubin: 0.5 mg/dL (ref 0.3–1.2)
Total Protein: 6 g/dL (ref 6.0–8.3)

## 2013-05-22 LAB — GLUCOSE, CAPILLARY
Glucose-Capillary: 362 mg/dL — ABNORMAL HIGH (ref 70–99)
Glucose-Capillary: 158 mg/dL — ABNORMAL HIGH (ref 70–99)
Glucose-Capillary: 184 mg/dL — ABNORMAL HIGH (ref 70–99)
Glucose-Capillary: 210 mg/dL — ABNORMAL HIGH (ref 70–99)
Glucose-Capillary: 113 mg/dL — ABNORMAL HIGH (ref 70–99)
Glucose-Capillary: 187 mg/dL — ABNORMAL HIGH (ref 70–99)
Glucose-Capillary: 203 mg/dL — ABNORMAL HIGH (ref 70–99)

## 2013-05-22 LAB — MRSA PCR SCREENING: MRSA by PCR: NEGATIVE

## 2013-05-22 LAB — CBC
HCT: 33.6 % — ABNORMAL LOW (ref 39.0–52.0)
Hemoglobin: 11.7 g/dL — ABNORMAL LOW (ref 13.0–17.0)
MCHC: 34.8 g/dL (ref 30.0–36.0)
MCV: 94.1 fL (ref 78.0–100.0)
Platelets: 238 10*3/uL (ref 150–400)
RDW: 12.4 % (ref 11.5–15.5)

## 2013-05-22 LAB — TROPONIN I: Troponin I: <0.3 ng/mL (ref <0.30)

## 2013-05-22 LAB — RAPID URINE DRUG SCREEN, HOSP PERFORMED
Amphetamines: NOT DETECTED
Benzodiazepines: NOT DETECTED
Cocaine: NOT DETECTED
Opiates: NOT DETECTED

## 2013-05-22 MED ORDER — SODIUM CHLORIDE 0.9 % IV SOLN
INTRAVENOUS | Status: AC
  Administered 2013-05-22: 20:00:00 via INTRAVENOUS

## 2013-05-22 MED ORDER — INSULIN PUMP
SUBCUTANEOUS | Status: DC
  Administered 2013-05-22: 2.95 via SUBCUTANEOUS
  Administered 2013-05-22: 2.25 via SUBCUTANEOUS
  Administered 2013-05-22: 2.45 via SUBCUTANEOUS
  Administered 2013-05-23: 12:00:00 via SUBCUTANEOUS
  Administered 2013-05-23: 2.65 via SUBCUTANEOUS
  Administered 2013-05-23: 08:00:00 via SUBCUTANEOUS
  Administered 2013-05-23: 2.65 via SUBCUTANEOUS
  Filled 2013-05-22: qty 1

## 2013-05-22 MED ORDER — BIOTENE DRY MOUTH MT LIQD
15.0000 mL | Freq: Two times a day (BID) | OROMUCOSAL | Status: DC
  Administered 2013-05-22 (×2): 15 mL via OROMUCOSAL

## 2013-05-22 MED ORDER — LISINOPRIL 10 MG PO TABS
40.0000 mg | ORAL_TABLET | Freq: Every day | ORAL | Status: DC
  Administered 2013-05-22 – 2013-05-23 (×2): 40 mg via ORAL
  Filled 2013-05-22 (×2): qty 4

## 2013-05-22 MED ORDER — METOCLOPRAMIDE HCL 5 MG/ML IJ SOLN
10.0000 mg | Freq: Three times a day (TID) | INTRAMUSCULAR | Status: DC
  Administered 2013-05-22 – 2013-05-23 (×4): 10 mg via INTRAVENOUS
  Filled 2013-05-22 (×4): qty 2

## 2013-05-22 NOTE — Progress Notes (Signed)
PATIENT DETAILS Name: Aaron Curry Age: 42 y.o. Sex: male Date of Birth: 1971/04/08 Admit Date: 05/21/2013 Admitting Physician Osvaldo Shipper, MD [id number], MD  Subjective: No further nausea or vomiting. Headache much better. Claims his endocrinologist in [place] told him last year that he did in-fact have gastroparesis. He claims that for the last one week-has has had large volume meals.  Assessment/Plan: Principal Problem: Uncontrolled DM Type 1 without DKA -better with Insulin gtt, will advance diet, and if tolerates, will switch back to Insulin pump. 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  -Not sure what the etiology is-however no headache this am.Could be secondary to uncontrolled HTN -CT head neg -Neuro eval  Uncontrolled hypertension -upon further questioning this am-patient not sure if he was taking all of his anti-hypertensives as prescribed. -continue with Metoprolol and Amlodipine, restart Lisinopril -follow BP and adjust accordingly   ARF (acute renal failure) on CKD stage 2 -suspect pre-renal etiology -gently hydrate and follow trend    Dehydration -secondary to nausea/vomiting -resolved with IVF    CAD (coronary artery disease) -stable, troponins neg.  Disposition: Remain inpatient  DVT Prophylaxis: Prophylactic  Heparin  Code Status: Full code  Family Communication None at bedside  Procedures:  None  CONSULTS:  None  Time spent 40 minutes-which includes 50% of the  time with face-to-face with patient regarding compliance with medications and small portion diet.   MEDICATIONS: Scheduled Meds: . amLODipine  10 mg Oral Daily  . antiseptic oral rinse  15 mL Mouth Rinse BID  . heparin  5,000 Units Subcutaneous Q8H  . insulin regular  0-10 Units Intravenous TID WC  . metoCLOPramide (REGLAN) injection  10 mg Intravenous TID AC  . metoprolol  25 mg Oral BID  . pantoprazole (PROTONIX) IV  40 mg Intravenous Q24H  . PARoxetine  60 mg Oral q morning - 10a  . simvastatin  10 mg Oral QPM  . sodium chloride  3 mL Intravenous Q12H  . traZODone  100 mg Oral QHS  . triamcinolone cream   Topical TID   Continuous Infusions: . sodium chloride 125 mL/hr at 05/22/13 0109  . dextrose 5 % and 0.45% NaCl    . insulin (NOVOLIN-R) infusion 3 Units/hr (05/22/13 0745)   PRN Meds:.acetaminophen, acetaminophen, dextrose, hydrALAZINE, HYDROmorphone (DILAUDID) injection, ondansetron (ZOFRAN) IV, ondansetron, oxyCODONE  Antibiotics: Anti-infectives   None       PHYSICAL EXAM: Vital signs in last 24 hours: Filed Vitals:   05/22/13 0500 05/22/13 0600 05/22/13 0700 05/22/13 0800  BP: 129/66 128/77 145/80 152/91  Pulse:    77  Temp:    97.8 F (36.6 C)  TempSrc:    Oral  Resp: 16 15 16 20   Height:      Weight: 69 kg (152 lb 1.9 oz)     SpO2:    100%    Weight change:  Filed Weights   05/21/13 2220 05/22/13 0500  Weight: 68.7 kg (151 lb 7.3 oz) 69 kg (152 lb 1.9 oz)   Body mass index is 24.56 kg/(m^2).   Gen Exam: Awake  and alert with clear speech.   Neck: Supple, No JVD.   Chest: B/L Clear.   CVS: S1 S2 Regular, no murmurs.  Abdomen: soft, BS +, non tender, non distended.  Extremities: no edema, lower extremities warm to touch. Neurologic: Non Focal.  Skin: No Rash.   Wounds: N/A.    Intake/Output from previous day:  Intake/Output Summary (Last 24 hours) at 05/22/13 0848 Last data filed at 05/22/13 0600  Gross per 24 hour  Intake 2876.02 ml   Output      0 ml  Net 2876.02 ml     LAB RESULTS: CBC  Recent Labs Lab 05/21/13 1505 05/22/13 0410  WBC 7.3 8.2  HGB 13.9 11.7*  HCT 39.6 33.6*  PLT 235 238  MCV 94.1 94.1  MCH 33.0 32.8  MCHC 35.1 34.8  RDW 12.2 12.4  LYMPHSABS 1.0  --   MONOABS 0.4  --   EOSABS 0.0  --   BASOSABS 0.0  --     Chemistries   Recent Labs Lab 05/21/13 1505 05/21/13 2221 05/22/13 0410  NA 135 134* 138  K 4.6 4.2 3.8  CL 94* 96 102  CO2 29 25 27   GLUCOSE 436* 356* 135*  BUN 26* 22 22  CREATININE 1.83* 1.57* 1.66*  CALCIUM 9.6 9.2 8.6    CBG:  Recent Labs Lab 05/22/13 0321 05/22/13 0423 05/22/13 0519 05/22/13 0632 05/22/13 0730  GLUCAP 158* 120* 126* 184* 210*    GFR Estimated Creatinine Clearance: 51.8 ml/min (by C-G formula based on Cr of 1.66).  Coagulation profile No results found for this basename: INR, PROTIME,  in the last 168 hours  Cardiac Enzymes  Recent Labs Lab 05/21/13 1505 05/21/13 2221 05/22/13 0410  TROPONINI <0.30 <0.30 <0.30    No components found with this basename: POCBNP,  No results found for this basename: DDIMER,  in the last 72 hours No results found for this basename: HGBA1C,  in the last 72 hours No results found for this basename: CHOL, HDL, LDLCALC, TRIG, CHOLHDL, LDLDIRECT,  in the last 72 hours No results found for this basename: TSH, T4TOTAL, FREET3, T3FREE, THYROIDAB,  in the last 72 hours No results found for this basename: VITAMINB12, FOLATE, FERRITIN, TIBC, IRON, RETICCTPCT,  in the last 72 hours  Recent Labs  05/21/13 1505  LIPASE 19    Urine Studies No results found for this basename: UACOL, UAPR, USPG, UPH, UTP, UGL, UKET, UBIL, UHGB, UNIT, UROB, ULEU, UEPI, UWBC, URBC, UBAC, CAST, CRYS, UCOM, BILUA,  in the last 72 hours  MICROBIOLOGY: Recent Results (from the past 240 hour(s))  MRSA PCR SCREENING     Status: None   Collection Time    05/21/13 10:18 PM      Result Value Range Status   MRSA by PCR NEGATIVE   NEGATIVE Final   Comment:            The GeneXpert MRSA Assay (FDA     approved for NASAL specimens     only), is one component of a     comprehensive MRSA colonization     surveillance program. It is not     intended to diagnose MRSA     infection nor to guide or     monitor treatment for     MRSA infections.    RADIOLOGY STUDIES/RESULTS: Ct Head Wo Contrast  05/21/2013   CLINICAL DATA:  Headache. Nausea and vomiting. History of hypertension and myocardial infarction. Diabetes.  EXAM: CT HEAD WITHOUT  CONTRAST  TECHNIQUE: Contiguous axial images were obtained from the base of the skull through the vertex without intravenous contrast.  COMPARISON:  None.  FINDINGS: Sinuses/Soft tissues: Prior left and likely right sided antrostomy. Paranasal sinuses and mastoid air cells otherwise unremarkable. Cerumen within the right external ear canal.  Intracranial: No mass lesion, hemorrhage, hydrocephalus, acute infarct, intra-axial, or extra-axial fluid collection.  IMPRESSION: No acute intracranial abnormality.   Electronically Signed   By: Jeronimo Greaves M.D.   On: 05/21/2013 21:27   Ct Chest Wo Contrast  05/21/2013   CLINICAL DATA:  Headache. Vomiting. Followup abnormal chest radiograph.  EXAM: CT CHEST WITHOUT CONTRAST  TECHNIQUE: Multidetector CT imaging of the chest was performed following the standard protocol without IV contrast.  COMPARISON:  None.  FINDINGS: There is a hyperlucent and hypoperfused left upper lobe. There is a tubular and branching structure within the left upper lobe that is felt to likely represent a bronchocele. There is no suspicious pulmonary nodule or mass identified. No airspace consolidation or atelectasis identified. No suspicious pulmonary nodule or mass identified.  The trachea appears patent and is midline. The heart size is normal. Calcifications involving the LAD coronary artery noted.  No axillary or supraclavicular adenopathy. Incidental imaging through the upper abdomen  shows no acute findings.  IMPRESSION: 1. No suspicious nodule or mass identified. 2. 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   Electronically Signed   By: Signa Kell M.D.   On: 05/21/2013 17:42   Dg Abd Acute W/chest  05/21/2013   CLINICAL DATA:  Myocardial infarction.  Nausea and vomiting.  EXAM: ACUTE ABDOMEN SERIES (ABDOMEN 2 VIEW & CHEST 1 VIEW)  COMPARISON:  01/15/2013  FINDINGS: Severe left upper bullous lung disease. Old left-sided rib fractures. Nodularity in the left mid lung seems progressive when compared to 01/30/2010, and concerning for malignancy.  No free intraperitoneal gas observed in the abdomen. Scattered gas in the large and small bowel with a few scattered air-fluid levels but without definite dilated bowel or compelling findings for obstruction.  IMPRESSION: 1. Left mid lung nodularity suspicious for lung cancer. Admittedly this is a confusing appearance due to the way that the bullous lung disease in the region accentuates the hilar vasculature. CT of the chest (with contrast if feasible) is recommended now for further workup.   Electronically Signed   By: Herbie Baltimore M.D.   On: 05/21/2013 16:44    Jeoffrey Massed, MD  [facility] Pager:[phone]  If 7PM-7AM, please contact night-coverage [email or web address] Password [email or web address] 05/22/2013, 8:48 AM   LOS: 1 day

## 2013-05-22 NOTE — Progress Notes (Signed)
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 He reports that both of his parents had diabetes and he is certain that his mother had Type 1 DM.  He is followed by an endocrinologist at the [place] in [place].  Mr. Strother reports that he has been on an insulin pump for 2 years and his diabetes has improved significantly since he went on the pump.  He reports that he sees the [place] endocrinologist every 6 months and his last A1C was 7.2%.  He has an [employer] insulin pump and he is able to verbalize his settings without looking at his pump.  He states the following are his insulin pump settings:  Basal rate: 0.775 units/hour Insulin to car ratio: 1 unit for every 13 grams of carbs Insulin sensitivity: 1 unit drops blood glucose 38 mg/dl Target glucose: 161 mg/dl  Currently patient has his insulin pump off since he is on an insulin drip via Glucostabilizer.  Patient reports that he has all his supplies with him to restart his insulin pump once the doctor gives orders for him to transition back to his insulin pump.  Patient is very knowledgeable about diabetes and expresses a fear of "going blind or having amputations" due to uncontrolled diabetes.  He states that he saw his mother suffer from the complications of uncontrolled diabetes and he wants to remain healthy.  He talked about his two boys (ages 72 and 79) and they keep him motivated to stay healthy.  In talking about nutrition, patient reports that he was recently told at the [place] that he had gastroparesis but he did not receive any education on gastroparesis.  Briefly discussed gastroparesis  and have consulted RD to educate on gastroparesis in more detail.  Patient verbalized understanding of information discussed and states that he does not have any further questions at this time related to diabetes.  Will continue to follow.  Thanks, Orlando Penner, RN, MSN, CCRN Diabetes Coordinator Inpatient Diabetes Program [phone] (Team Pager) [phone] ([facility] office) [phone] [facility] office)

## 2013-05-22 NOTE — Progress Notes (Signed)
Insulin drip started at 2317. Pt's insulin pump removed prior to insulin drip initiation. Pt instructed to d/c use of his home insulin pump while on the insulin drip.

## 2013-05-22 NOTE — Plan of Care (Signed)
Problem: Consults Goal: Diagnosis-Diabetes Mellitus Hyperglycemia     

## 2013-05-22 NOTE — Progress Notes (Signed)
UR chart review completed.  

## 2013-05-23 LAB — BASIC METABOLIC PANEL
CO2: 26 mEq/L (ref 19–32)
Calcium: 8.7 mg/dL (ref 8.4–10.5)
Creatinine, Ser: 1.62 mg/dL — ABNORMAL HIGH (ref 0.50–1.35)
GFR calc Af Amer: 59 mL/min — ABNORMAL LOW (ref >90)
GFR calc non Af Amer: 50 mL/min — ABNORMAL LOW (ref >90)
Glucose, Bld: 237 mg/dL — ABNORMAL HIGH (ref 70–99)
Potassium: 3.7 mEq/L (ref 3.5–5.1)
Sodium: 133 mEq/L — ABNORMAL LOW (ref 135–145)

## 2013-05-23 LAB — GLUCOSE, CAPILLARY
Glucose-Capillary: 242 mg/dL — ABNORMAL HIGH (ref 70–99)
Glucose-Capillary: 206 mg/dL — ABNORMAL HIGH (ref 70–99)
Glucose-Capillary: 312 mg/dL — ABNORMAL HIGH (ref 70–99)

## 2013-05-23 LAB — TSH: TSH: 2.297 u[IU]/mL (ref 0.350–4.500)

## 2013-05-23 MED ORDER — PROMETHAZINE HCL 25 MG PO TABS: 25.0000 mg | ORAL_TABLET | Freq: Four times a day (QID) | ORAL | Status: DC | PRN

## 2013-05-23 MED ORDER — METOPROLOL TARTRATE 50 MG PO TABS
50.0000 mg | ORAL_TABLET | Freq: Two times a day (BID) | ORAL | Status: DC
Start: 2013-05-23 — End: 2013-05-23

## 2013-05-23 MED ORDER — PANTOPRAZOLE SODIUM 40 MG PO TBEC: 40.0000 mg | DELAYED_RELEASE_TABLET | Freq: Every day | ORAL | Status: DC

## 2013-05-23 MED ORDER — METOCLOPRAMIDE HCL 10 MG PO TABS
5.0000 mg | ORAL_TABLET | Freq: Three times a day (TID) | ORAL | Status: DC
  Administered 2013-05-23: 5 mg via ORAL
  Filled 2013-05-23: qty 1

## 2013-05-23 MED ORDER — TOPIRAMATE 25 MG PO TABS: 25.0000 mg | ORAL_TABLET | Freq: Every day | ORAL | Status: DC

## 2013-05-23 MED ORDER — METOPROLOL TARTRATE 50 MG PO TABS: 50.0000 mg | ORAL_TABLET | Freq: Two times a day (BID) | ORAL | Status: AC

## 2013-05-23 MED ORDER — TOPIRAMATE 25 MG PO TABS
25.0000 mg | ORAL_TABLET | Freq: Every day | ORAL | Status: DC
  Administered 2013-05-23: 25 mg via ORAL
  Filled 2013-05-23 (×4): qty 1

## 2013-05-23 MED ORDER — METOCLOPRAMIDE HCL 5 MG PO TABS: 5.0000 mg | ORAL_TABLET | Freq: Three times a day (TID) | ORAL | Status: DC
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
## 2013-05-23 NOTE — Care Management Note (Addendum)
    Page 1 of 1   05/23/2013     12:09:04 PM   CARE MANAGEMENT NOTE 05/23/2013  Patient:  Aaron Curry, Aaron Curry   Account Number:  [id number]  Date Initiated:  05/23/2013  Documentation initiated by:  Sharrie Rothman  Subjective/Objective Assessment:   Pt admitted from home with DKA. Pt lives alone and will return home at discharge. Pt is independent with ADL's. Pt is [facility] [place] pt and transfer form faxed to [facility] and message left with transfer coordinator.     Action/Plan:   No CM needs noted. No return phone call from [place] about transfer. Pt discharged home today.   Anticipated DC Date:  05/23/2013   Anticipated DC Plan:  HOME/SELF CARE      DC Planning Services  CM consult      Choice offered to / List presented to:             Status of service:  Completed, signed off Medicare Important Message given?  NA - LOS <3 / Initial given by admissions (If response is "NO", the following Medicare IM given date fields will be blank) Date Medicare IM given:   Date Additional Medicare IM given:    Discharge Disposition:  HOME/SELF CARE  Per UR Regulation:    If discussed at Long Length of Stay Meetings, dates discussed:    Comments:  05/23/13 1205 Arlyss Queen, RN BSN CM Dr. Gerilyn Pilgrim would like for pt to have high flow O2 for home use with pts headaches. Pt stated that he would have his PCP at the [place] arrange this for him. Information given to pt by RN for PCP to arrange the O2.  05/23/13 1055 Arlyss Queen, RN BSN CM

## 2013-05-23 NOTE — Plan of Care (Signed)
Problem: Consults Goal: Diagnosis-Diabetes Mellitus Outcome: Completed/Met Date Met:  05/23/13 Hyperglycemia

## 2013-05-23 NOTE — Progress Notes (Signed)
Pt discharged home today per Dr. Jerral Ralph. Pt's IV site D/C'd and WNL. Pt's VS stable at this time. Pt provided with home medication list, discharge instructions, prescriptions, written instructions regarding O2 use for HA's and progress note from Neurologist, with instructions to give to PCP upon next visit. Verbalized understanding. Pt ambulated off floor in stable condition accompanied by NT in stable condition.

## 2014-03-19 ENCOUNTER — Encounter (HOSPITAL_COMMUNITY): Payer: Self-pay | Admitting: Emergency Medicine

## 2014-03-19 ENCOUNTER — Inpatient Hospital Stay (HOSPITAL_COMMUNITY)
Admission: EM | Admit: 2014-03-19 | Discharge: 2014-03-21 | DRG: 287 | Disposition: A | Discharge status: Home / Self Care | Payer: Medicare Other | Attending: Internal Medicine | Admitting: Internal Medicine

## 2014-03-19 DIAGNOSIS — I252 Old myocardial infarction: Secondary | ICD-10-CM

## 2014-03-19 DIAGNOSIS — E860 Dehydration: Secondary | ICD-10-CM | POA: Diagnosis not present

## 2014-03-19 DIAGNOSIS — I25110 Atherosclerotic heart disease of native coronary artery with unstable angina pectoris: Secondary | ICD-10-CM

## 2014-03-19 DIAGNOSIS — E1043 Type 1 diabetes mellitus with diabetic autonomic (poly)neuropathy: Secondary | ICD-10-CM | POA: Diagnosis present

## 2014-03-19 DIAGNOSIS — E785 Hyperlipidemia, unspecified: Secondary | ICD-10-CM | POA: Diagnosis present

## 2014-03-19 DIAGNOSIS — F4310 Post-traumatic stress disorder, unspecified: Secondary | ICD-10-CM | POA: Diagnosis present

## 2014-03-19 DIAGNOSIS — Z955 Presence of coronary angioplasty implant and graft: Secondary | ICD-10-CM

## 2014-03-19 DIAGNOSIS — N183 Chronic kidney disease, stage 3 unspecified: Secondary | ICD-10-CM | POA: Diagnosis present

## 2014-03-19 DIAGNOSIS — Z9119 Patient's noncompliance with other medical treatment and regimen: Secondary | ICD-10-CM | POA: Diagnosis present

## 2014-03-19 DIAGNOSIS — I200 Unstable angina: Secondary | ICD-10-CM | POA: Diagnosis present

## 2014-03-19 DIAGNOSIS — K219 Gastro-esophageal reflux disease without esophagitis: Secondary | ICD-10-CM | POA: Diagnosis present

## 2014-03-19 DIAGNOSIS — IMO0002 Reserved for concepts with insufficient information to code with codable children: Secondary | ICD-10-CM

## 2014-03-19 DIAGNOSIS — E162 Hypoglycemia, unspecified: Secondary | ICD-10-CM

## 2014-03-19 DIAGNOSIS — Z833 Family history of diabetes mellitus: Secondary | ICD-10-CM

## 2014-03-19 DIAGNOSIS — G4700 Insomnia, unspecified: Secondary | ICD-10-CM | POA: Diagnosis present

## 2014-03-19 DIAGNOSIS — E1143 Type 2 diabetes mellitus with diabetic autonomic (poly)neuropathy: Secondary | ICD-10-CM | POA: Diagnosis present

## 2014-03-19 DIAGNOSIS — E10649 Type 1 diabetes mellitus with hypoglycemia without coma: Secondary | ICD-10-CM | POA: Diagnosis present

## 2014-03-19 DIAGNOSIS — R112 Nausea with vomiting, unspecified: Secondary | ICD-10-CM

## 2014-03-19 DIAGNOSIS — F1210 Cannabis abuse, uncomplicated: Secondary | ICD-10-CM | POA: Diagnosis present

## 2014-03-19 DIAGNOSIS — F603 Borderline personality disorder: Secondary | ICD-10-CM | POA: Diagnosis present

## 2014-03-19 DIAGNOSIS — I2510 Atherosclerotic heart disease of native coronary artery without angina pectoris: Secondary | ICD-10-CM | POA: Diagnosis present

## 2014-03-19 DIAGNOSIS — E1021 Type 1 diabetes mellitus with diabetic nephropathy: Secondary | ICD-10-CM | POA: Diagnosis present

## 2014-03-19 DIAGNOSIS — F328 Other depressive episodes: Secondary | ICD-10-CM | POA: Diagnosis present

## 2014-03-19 DIAGNOSIS — I161 Hypertensive emergency: Secondary | ICD-10-CM

## 2014-03-19 DIAGNOSIS — K3184 Gastroparesis: Secondary | ICD-10-CM | POA: Diagnosis present

## 2014-03-19 DIAGNOSIS — Z8249 Family history of ischemic heart disease and other diseases of the circulatory system: Secondary | ICD-10-CM

## 2014-03-19 DIAGNOSIS — E1165 Type 2 diabetes mellitus with hyperglycemia: Secondary | ICD-10-CM

## 2014-03-19 DIAGNOSIS — I25118 Atherosclerotic heart disease of native coronary artery with other forms of angina pectoris: Secondary | ICD-10-CM

## 2014-03-19 DIAGNOSIS — R739 Hyperglycemia, unspecified: Secondary | ICD-10-CM

## 2014-03-19 DIAGNOSIS — R0789 Other chest pain: Secondary | ICD-10-CM

## 2014-03-19 DIAGNOSIS — Z87891 Personal history of nicotine dependence: Secondary | ICD-10-CM

## 2014-03-19 DIAGNOSIS — F1990 Other psychoactive substance use, unspecified, uncomplicated: Secondary | ICD-10-CM

## 2014-03-19 DIAGNOSIS — I10 Essential (primary) hypertension: Secondary | ICD-10-CM | POA: Diagnosis present

## 2014-03-19 DIAGNOSIS — E1065 Type 1 diabetes mellitus with hyperglycemia: Secondary | ICD-10-CM | POA: Diagnosis present

## 2014-03-19 DIAGNOSIS — E1029 Type 1 diabetes mellitus with other diabetic kidney complication: Secondary | ICD-10-CM | POA: Diagnosis present

## 2014-03-19 DIAGNOSIS — Z9641 Presence of insulin pump (external) (internal): Secondary | ICD-10-CM | POA: Diagnosis present

## 2014-03-19 DIAGNOSIS — I160 Hypertensive urgency: Secondary | ICD-10-CM

## 2014-03-19 DIAGNOSIS — I129 Hypertensive chronic kidney disease with stage 1 through stage 4 chronic kidney disease, or unspecified chronic kidney disease: Secondary | ICD-10-CM | POA: Diagnosis present

## 2014-03-19 DIAGNOSIS — Z886 Allergy status to analgesic agent status: Secondary | ICD-10-CM

## 2014-03-19 HISTORY — DX: Chronic kidney disease, stage 3 unspecified: N18.30

## 2014-03-19 HISTORY — DX: Essential (primary) hypertension: I10

## 2014-03-19 HISTORY — DX: Atherosclerotic heart disease of native coronary artery without angina pectoris: I25.10

## 2014-03-19 HISTORY — DX: Type 2 diabetes mellitus without complications: E11.9

## 2014-03-19 HISTORY — DX: Chronic kidney disease, stage 3 (moderate): N18.3

## 2014-03-19 LAB — COMPREHENSIVE METABOLIC PANEL
ALBUMIN: 4.4 g/dL (ref 3.5–5.2)
ALK PHOS: 137 U/L — AB (ref 39–117)
ALT: 7 U/L (ref 0–53)
ANION GAP: 13 (ref 5–15)
AST: 17 U/L (ref 0–37)
BILIRUBIN TOTAL: 0.6 mg/dL (ref 0.3–1.2)
BUN: 21 mg/dL (ref 6–23)
CHLORIDE: 97 meq/L (ref 96–112)
CO2: 29 mEq/L (ref 19–32)
Calcium: 9.9 mg/dL (ref 8.4–10.5)
Creatinine, Ser: 1.74 mg/dL — ABNORMAL HIGH (ref 0.50–1.35)
GFR calc Af Amer: 53 mL/min — ABNORMAL LOW (ref >90)
GFR calc non Af Amer: 46 mL/min — ABNORMAL LOW (ref >90)
Glucose, Bld: 193 mg/dL — ABNORMAL HIGH (ref 70–99)
Potassium: 4 mEq/L (ref 3.7–5.3)
SODIUM: 139 meq/L (ref 137–147)
TOTAL PROTEIN: 8.2 g/dL (ref 6.0–8.3)

## 2014-03-19 LAB — TROPONIN I

## 2014-03-19 LAB — CBC WITH DIFFERENTIAL/PLATELET
Basophils Absolute: 0 10*3/uL (ref 0.0–0.1)
Basophils Relative: 0 % (ref 0–1)
Eosinophils Absolute: 0.1 10*3/uL (ref 0.0–0.7)
Eosinophils Relative: 2 % (ref 0–5)
HCT: 41.2 % (ref 39.0–52.0)
Hemoglobin: 14.9 g/dL (ref 13.0–17.0)
Lymphocytes Relative: 22 % (ref 12–46)
Lymphs Abs: 1.5 10*3/uL (ref 0.7–4.0)
MCH: 33.5 pg (ref 26.0–34.0)
MCHC: 36.2 g/dL — ABNORMAL HIGH (ref 30.0–36.0)
MCV: 92.6 fL (ref 78.0–100.0)
Monocytes Absolute: 0.4 10*3/uL (ref 0.1–1.0)
Monocytes Relative: 6 % (ref 3–12)
Neutro Abs: 4.9 10*3/uL (ref 1.7–7.7)
Neutrophils Relative %: 70 % (ref 43–77)
Platelets: 280 10*3/uL (ref 150–400)
RBC: 4.45 MIL/uL (ref 4.22–5.81)
RDW: 12.8 % (ref 11.5–15.5)
WBC: 7 10*3/uL (ref 4.0–10.5)

## 2014-03-19 LAB — RAPID URINE DRUG SCREEN, HOSP PERFORMED
AMPHETAMINES: NOT DETECTED
BENZODIAZEPINES: NOT DETECTED
Barbiturates: NOT DETECTED
COCAINE: NOT DETECTED
Opiates: POSITIVE — AB
TETRAHYDROCANNABINOL: POSITIVE — AB

## 2014-03-19 LAB — CBG MONITORING, ED
GLUCOSE-CAPILLARY: 102 mg/dL — AB (ref 70–99)
Glucose-Capillary: 191 mg/dL — ABNORMAL HIGH (ref 70–99)
Glucose-Capillary: 61 mg/dL — ABNORMAL LOW (ref 70–99)

## 2014-03-19 LAB — PROTIME-INR
INR: 0.95 (ref 0.00–1.49)
Prothrombin Time: 12.7 seconds (ref 11.6–15.2)

## 2014-03-19 LAB — APTT: aPTT: 32 seconds (ref 24–37)

## 2014-03-19 MED ORDER — LORAZEPAM 2 MG/ML IJ SOLN
0.5000 mg | Freq: Once | INTRAMUSCULAR | Status: AC
  Administered 2014-03-19: 0.5 mg via INTRAVENOUS
  Filled 2014-03-19: qty 1

## 2014-03-19 MED ORDER — ASPIRIN 81 MG PO CHEW
324.0000 mg | CHEWABLE_TABLET | Freq: Once | ORAL | Status: AC
  Administered 2014-03-19: 324 mg via ORAL
  Filled 2014-03-19: qty 4

## 2014-03-19 MED ORDER — MORPHINE SULFATE 4 MG/ML IJ SOLN
4.0000 mg | Freq: Once | INTRAMUSCULAR | Status: AC
  Administered 2014-03-19: 4 mg via INTRAVENOUS
  Filled 2014-03-19: qty 1

## 2014-03-19 MED ORDER — NITROGLYCERIN IN D5W 200-5 MCG/ML-% IV SOLN
5.0000 ug/min | Freq: Once | INTRAVENOUS | Status: AC
  Administered 2014-03-19: 16.667 ug/min via INTRAVENOUS
  Filled 2014-03-19: qty 250

## 2014-03-19 MED ORDER — GI COCKTAIL ~~LOC~~
30.0000 mL | Freq: Three times a day (TID) | ORAL | Status: DC | PRN
  Administered 2014-03-20: 30 mL via ORAL
  Filled 2014-03-19: qty 30

## 2014-03-19 MED ORDER — ONDANSETRON HCL 4 MG/2ML IJ SOLN
4.0000 mg | Freq: Once | INTRAMUSCULAR | Status: AC
  Administered 2014-03-19: 4 mg via INTRAVENOUS
  Filled 2014-03-19: qty 2
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
## 2014-03-19 NOTE — ED Notes (Signed)
Pt reports his chest pain has started to increase. Increased the nitro.

## 2014-03-19 NOTE — ED Notes (Addendum)
Pt states chest pain increasing & feels like his blood sugar is getting low. CBG done 61. Pt given crackers & a drink. Pt adjusted his insulin pump.

## 2014-03-19 NOTE — ED Notes (Signed)
Pt with CP off and on since Monday, nausea and SOB started at 1400 this afternoon, hx of MI x 2 with stent placement per pt

## 2014-03-20 ENCOUNTER — Encounter (HOSPITAL_COMMUNITY)
Admission: EM | Disposition: A | Discharge status: Home / Self Care | Payer: Self-pay | Source: Home / Self Care | Attending: Internal Medicine

## 2014-03-20 ENCOUNTER — Inpatient Hospital Stay (HOSPITAL_COMMUNITY): Payer: Medicare Other

## 2014-03-20 ENCOUNTER — Observation Stay (HOSPITAL_COMMUNITY): Payer: Medicare Other

## 2014-03-20 ENCOUNTER — Encounter (HOSPITAL_COMMUNITY): Payer: Self-pay | Admitting: Cardiology

## 2014-03-20 DIAGNOSIS — E1029 Type 1 diabetes mellitus with other diabetic kidney complication: Secondary | ICD-10-CM | POA: Diagnosis present

## 2014-03-20 DIAGNOSIS — I200 Unstable angina: Secondary | ICD-10-CM

## 2014-03-20 DIAGNOSIS — I25110 Atherosclerotic heart disease of native coronary artery with unstable angina pectoris: Principal | ICD-10-CM

## 2014-03-20 DIAGNOSIS — R110 Nausea: Secondary | ICD-10-CM

## 2014-03-20 DIAGNOSIS — E860 Dehydration: Secondary | ICD-10-CM

## 2014-03-20 DIAGNOSIS — E1021 Type 1 diabetes mellitus with diabetic nephropathy: Secondary | ICD-10-CM

## 2014-03-20 DIAGNOSIS — I25118 Atherosclerotic heart disease of native coronary artery with other forms of angina pectoris: Secondary | ICD-10-CM

## 2014-03-20 DIAGNOSIS — R531 Weakness: Secondary | ICD-10-CM | POA: Insufficient documentation

## 2014-03-20 HISTORY — PX: LEFT HEART CATHETERIZATION WITH CORONARY ANGIOGRAM: SHX5451

## 2014-03-20 LAB — COMPREHENSIVE METABOLIC PANEL
ALT: 6 U/L (ref 0–53)
ANION GAP: 12 (ref 5–15)
AST: 12 U/L (ref 0–37)
Albumin: 3.4 g/dL — ABNORMAL LOW (ref 3.5–5.2)
Alkaline Phosphatase: 101 U/L (ref 39–117)
BUN: 22 mg/dL (ref 6–23)
CALCIUM: 8.8 mg/dL (ref 8.4–10.5)
CO2: 28 meq/L (ref 19–32)
CREATININE: 1.69 mg/dL — AB (ref 0.50–1.35)
Chloride: 99 mEq/L (ref 96–112)
GFR, EST AFRICAN AMERICAN: 55 mL/min — AB (ref >90)
GFR, EST NON AFRICAN AMERICAN: 48 mL/min — AB (ref >90)
GLUCOSE: 131 mg/dL — AB (ref 70–99)
Potassium: 3.5 mEq/L — ABNORMAL LOW (ref 3.7–5.3)
Sodium: 139 mEq/L (ref 137–147)
Total Bilirubin: 0.3 mg/dL (ref 0.3–1.2)
Total Protein: 6.3 g/dL (ref 6.0–8.3)

## 2014-03-20 LAB — BASIC METABOLIC PANEL
ANION GAP: 15 (ref 5–15)
Anion gap: 15 (ref 5–15)
BUN: 24 mg/dL — ABNORMAL HIGH (ref 6–23)
BUN: 23 mg/dL (ref 6–23)
CHLORIDE: 96 meq/L (ref 96–112)
CO2: 24 meq/L (ref 19–32)
CO2: 23 meq/L (ref 19–32)
Calcium: 9.4 mg/dL (ref 8.4–10.5)
Calcium: 8.7 mg/dL (ref 8.4–10.5)
Chloride: 95 mEq/L — ABNORMAL LOW (ref 96–112)
Creatinine, Ser: 1.54 mg/dL — ABNORMAL HIGH (ref 0.50–1.35)
Creatinine, Ser: 1.48 mg/dL — ABNORMAL HIGH (ref 0.50–1.35)
GFR calc Af Amer: 62 mL/min — ABNORMAL LOW (ref >90)
GFR calc Af Amer: 65 mL/min — ABNORMAL LOW (ref >90)
GFR calc non Af Amer: 53 mL/min — ABNORMAL LOW (ref >90)
GFR calc non Af Amer: 56 mL/min — ABNORMAL LOW (ref >90)
GLUCOSE: 263 mg/dL — AB (ref 70–99)
Glucose, Bld: 205 mg/dL — ABNORMAL HIGH (ref 70–99)
POTASSIUM: 4.1 meq/L (ref 3.7–5.3)
Potassium: 4 mEq/L (ref 3.7–5.3)
SODIUM: 134 meq/L — AB (ref 137–147)
SODIUM: 134 meq/L — AB (ref 137–147)

## 2014-03-20 LAB — TROPONIN I

## 2014-03-20 LAB — GLUCOSE, CAPILLARY
GLUCOSE-CAPILLARY: 408 mg/dL — AB (ref 70–99)
GLUCOSE-CAPILLARY: 257 mg/dL — AB (ref 70–99)
GLUCOSE-CAPILLARY: 218 mg/dL — AB (ref 70–99)
Glucose-Capillary: 105 mg/dL — ABNORMAL HIGH (ref 70–99)
Glucose-Capillary: 114 mg/dL — ABNORMAL HIGH (ref 70–99)
Glucose-Capillary: 131 mg/dL — ABNORMAL HIGH (ref 70–99)
Glucose-Capillary: 322 mg/dL — ABNORMAL HIGH (ref 70–99)
Glucose-Capillary: 379 mg/dL — ABNORMAL HIGH (ref 70–99)
Glucose-Capillary: 44 mg/dL — CL (ref 70–99)
Glucose-Capillary: 59 mg/dL — ABNORMAL LOW (ref 70–99)

## 2014-03-20 LAB — HEMOGLOBIN A1C
Hgb A1c MFr Bld: 8.1 % — ABNORMAL HIGH (ref <5.7)
Mean Plasma Glucose: 186 mg/dL — ABNORMAL HIGH (ref <117)

## 2014-03-20 SURGERY — LEFT HEART CATHETERIZATION WITH CORONARY ANGIOGRAM
Anesthesia: LOCAL

## 2014-03-20 MED ORDER — INSULIN ASPART 100 UNIT/ML ~~LOC~~ SOLN: 0.0000 [IU] | Freq: Every day | SUBCUTANEOUS | Status: DC

## 2014-03-20 MED ORDER — SODIUM CHLORIDE 0.9 % IV SOLN: 1.0000 mL/kg/h | INTRAVENOUS | Status: DC

## 2014-03-20 MED ORDER — ACETAMINOPHEN 650 MG RE SUPP: 650.0000 mg | Freq: Four times a day (QID) | RECTAL | Status: DC | PRN

## 2014-03-20 MED ORDER — INSULIN ASPART 100 UNIT/ML ~~LOC~~ SOLN: 4.0000 [IU] | Freq: Three times a day (TID) | SUBCUTANEOUS | Status: DC

## 2014-03-20 MED ORDER — INSULIN PUMP
Freq: Three times a day (TID) | SUBCUTANEOUS | Status: DC
  Administered 2014-03-20: 08:00:00 via SUBCUTANEOUS
  Filled 2014-03-20: qty 1

## 2014-03-20 MED ORDER — FENTANYL CITRATE 0.05 MG/ML IJ SOLN
INTRAMUSCULAR | Status: AC
  Filled 2014-03-20: qty 2

## 2014-03-20 MED ORDER — SODIUM CHLORIDE 0.45 % IV SOLN
INTRAVENOUS | Status: DC
  Administered 2014-03-20: 01:00:00 via INTRAVENOUS

## 2014-03-20 MED ORDER — PANTOPRAZOLE SODIUM 40 MG PO TBEC
40.0000 mg | DELAYED_RELEASE_TABLET | Freq: Every day | ORAL | Status: DC
  Administered 2014-03-20 – 2014-03-21 (×2): 40 mg via ORAL
  Filled 2014-03-20 (×2): qty 1

## 2014-03-20 MED ORDER — HEPARIN (PORCINE) IN NACL 2-0.9 UNIT/ML-% IJ SOLN
INTRAMUSCULAR | Status: AC
  Filled 2014-03-20: qty 500

## 2014-03-20 MED ORDER — HEPARIN SODIUM (PORCINE) 1000 UNIT/ML IJ SOLN
INTRAMUSCULAR | Status: AC
  Filled 2014-03-20: qty 1

## 2014-03-20 MED ORDER — HEPARIN SODIUM (PORCINE) 5000 UNIT/ML IJ SOLN: 5000.0000 [IU] | Freq: Three times a day (TID) | INTRAMUSCULAR | Status: DC

## 2014-03-20 MED ORDER — PAROXETINE HCL 30 MG PO TABS
60.0000 mg | ORAL_TABLET | Freq: Every morning | ORAL | Status: DC
  Administered 2014-03-20 – 2014-03-21 (×2): 60 mg via ORAL
  Filled 2014-03-20: qty 3
  Filled 2014-03-20: qty 2

## 2014-03-20 MED ORDER — SODIUM CHLORIDE 0.9 % IV SOLN
INTRAVENOUS | Status: DC
  Administered 2014-03-20: 17:00:00 via INTRAVENOUS

## 2014-03-20 MED ORDER — INSULIN ASPART 100 UNIT/ML ~~LOC~~ SOLN: 0.0000 [IU] | Freq: Three times a day (TID) | SUBCUTANEOUS | Status: DC

## 2014-03-20 MED ORDER — HYDRALAZINE HCL 20 MG/ML IJ SOLN
5.0000 mg | INTRAMUSCULAR | Status: DC | PRN
  Administered 2014-03-20: 5 mg via INTRAVENOUS
  Filled 2014-03-20: qty 1

## 2014-03-20 MED ORDER — SODIUM CHLORIDE 0.9 % IJ SOLN
3.0000 mL | Freq: Two times a day (BID) | INTRAMUSCULAR | Status: DC
Start: 2014-03-20 — End: 2014-03-21
  Administered 2014-03-20 – 2014-03-21 (×2): 3 mL via INTRAVENOUS

## 2014-03-20 MED ORDER — HEPARIN SODIUM (PORCINE) 5000 UNIT/ML IJ SOLN
5000.0000 [IU] | Freq: Three times a day (TID) | INTRAMUSCULAR | Status: DC
  Administered 2014-03-20 – 2014-03-21 (×4): 5000 [IU] via SUBCUTANEOUS
  Filled 2014-03-20 (×6): qty 1

## 2014-03-20 MED ORDER — ONDANSETRON HCL 4 MG/2ML IJ SOLN
4.0000 mg | Freq: Four times a day (QID) | INTRAMUSCULAR | Status: DC | PRN
  Administered 2014-03-20: 4 mg via INTRAVENOUS
  Filled 2014-03-20: qty 2

## 2014-03-20 MED ORDER — MIDAZOLAM HCL 2 MG/2ML IJ SOLN
INTRAMUSCULAR | Status: AC
  Filled 2014-03-20: qty 2

## 2014-03-20 MED ORDER — ONDANSETRON HCL 4 MG PO TABS: 4.0000 mg | ORAL_TABLET | Freq: Four times a day (QID) | ORAL | Status: DC | PRN

## 2014-03-20 MED ORDER — TRAZODONE HCL 100 MG PO TABS
100.0000 mg | ORAL_TABLET | Freq: Every day | ORAL | Status: DC
  Administered 2014-03-20 (×2): 100 mg via ORAL
  Filled 2014-03-20: qty 1
  Filled 2014-03-20: qty 2
  Filled 2014-03-20: qty 1

## 2014-03-20 MED ORDER — VERAPAMIL HCL 2.5 MG/ML IV SOLN
INTRAVENOUS | Status: AC
  Filled 2014-03-20: qty 2

## 2014-03-20 MED ORDER — SIMVASTATIN 10 MG PO TABS
10.0000 mg | ORAL_TABLET | Freq: Every evening | ORAL | Status: DC
  Filled 2014-03-20 (×2): qty 1

## 2014-03-20 MED ORDER — INSULIN PUMP
Freq: Three times a day (TID) | SUBCUTANEOUS | Status: DC
  Administered 2014-03-21: 0.2 via SUBCUTANEOUS
  Filled 2014-03-20: qty 1

## 2014-03-20 MED ORDER — ASPIRIN 81 MG PO CHEW: 81.0000 mg | CHEWABLE_TABLET | ORAL | Status: DC

## 2014-03-20 MED ORDER — DEXTROSE-NACL 5-0.45 % IV SOLN
INTRAVENOUS | Status: AC
  Administered 2014-03-20: 22:00:00 via INTRAVENOUS

## 2014-03-20 MED ORDER — METOCLOPRAMIDE HCL 5 MG/ML IJ SOLN
5.0000 mg | Freq: Four times a day (QID) | INTRAMUSCULAR | Status: DC | PRN
  Administered 2014-03-20: 5 mg via INTRAVENOUS
  Filled 2014-03-20: qty 1

## 2014-03-20 MED ORDER — ACETAMINOPHEN 325 MG PO TABS: 650.0000 mg | ORAL_TABLET | Freq: Four times a day (QID) | ORAL | Status: DC | PRN

## 2014-03-20 MED ORDER — SODIUM CHLORIDE 0.9 % IJ SOLN: 3.0000 mL | Freq: Two times a day (BID) | INTRAMUSCULAR | Status: DC

## 2014-03-20 MED ORDER — MORPHINE SULFATE 2 MG/ML IJ SOLN
2.0000 mg | INTRAMUSCULAR | Status: DC | PRN
  Administered 2014-03-20 (×3): 2 mg via INTRAVENOUS
  Filled 2014-03-20 (×4): qty 1

## 2014-03-20 MED ORDER — ACETAMINOPHEN 325 MG PO TABS: 650.0000 mg | ORAL_TABLET | ORAL | Status: DC | PRN

## 2014-03-20 MED ORDER — SENNA 8.6 MG PO TABS
1.0000 | ORAL_TABLET | Freq: Two times a day (BID) | ORAL | Status: DC
  Administered 2014-03-20 – 2014-03-21 (×4): 8.6 mg via ORAL
  Filled 2014-03-20 (×5): qty 1

## 2014-03-20 MED ORDER — ZOLPIDEM TARTRATE 5 MG PO TABS
10.0000 mg | ORAL_TABLET | Freq: Every evening | ORAL | Status: DC | PRN
  Administered 2014-03-20 (×2): 10 mg via ORAL
  Filled 2014-03-20 (×2): qty 2

## 2014-03-20 MED ORDER — DEXTROSE 50 % IV SOLN
25.0000 mL | Freq: Once | INTRAVENOUS | Status: AC | PRN
  Administered 2014-03-20: 09:00:00 via INTRAVENOUS

## 2014-03-20 MED ORDER — NITROGLYCERIN 2 % TD OINT
0.5000 [in_us] | TOPICAL_OINTMENT | Freq: Four times a day (QID) | TRANSDERMAL | Status: DC
  Administered 2014-03-20: 0.5 [in_us] via TOPICAL
  Filled 2014-03-20: qty 1

## 2014-03-20 MED ORDER — SODIUM CHLORIDE 0.9 % IJ SOLN: 3.0000 mL | INTRAMUSCULAR | Status: DC | PRN

## 2014-03-20 MED ORDER — LISINOPRIL 40 MG PO TABS
40.0000 mg | ORAL_TABLET | Freq: Every day | ORAL | Status: DC
  Administered 2014-03-20: 40 mg via ORAL
  Filled 2014-03-20: qty 1
  Filled 2014-03-20: qty 4

## 2014-03-20 MED ORDER — METOCLOPRAMIDE HCL 5 MG PO TABS
5.0000 mg | ORAL_TABLET | Freq: Three times a day (TID) | ORAL | Status: DC
  Administered 2014-03-20 – 2014-03-21 (×3): 5 mg via ORAL
  Filled 2014-03-20 (×6): qty 1

## 2014-03-20 MED ORDER — AMLODIPINE BESYLATE 10 MG PO TABS
10.0000 mg | ORAL_TABLET | Freq: Every day | ORAL | Status: DC
  Administered 2014-03-20 – 2014-03-21 (×2): 10 mg via ORAL
  Filled 2014-03-20: qty 1
  Filled 2014-03-20: qty 2

## 2014-03-20 MED ORDER — ONDANSETRON HCL 4 MG/2ML IJ SOLN: 4.0000 mg | Freq: Four times a day (QID) | INTRAMUSCULAR | Status: DC | PRN

## 2014-03-20 MED ORDER — SODIUM CHLORIDE 0.9 % IV SOLN: 250.0000 mL | INTRAVENOUS | Status: DC | PRN

## 2014-03-20 MED ORDER — LIDOCAINE HCL (PF) 1 % IJ SOLN
INTRAMUSCULAR | Status: AC
  Filled 2014-03-20: qty 30

## 2014-03-20 MED ORDER — DEXTROSE 50 % IV SOLN
INTRAVENOUS | Status: AC
  Filled 2014-03-20: qty 50

## 2014-03-20 MED ORDER — METOPROLOL TARTRATE 50 MG PO TABS
50.0000 mg | ORAL_TABLET | Freq: Two times a day (BID) | ORAL | Status: DC
  Administered 2014-03-20 – 2014-03-21 (×4): 50 mg via ORAL
  Filled 2014-03-20 (×5): qty 1

## 2014-03-20 MED ORDER — NITROGLYCERIN 1 MG/10 ML FOR IR/CATH LAB
INTRA_ARTERIAL | Status: AC
  Filled 2014-03-20: qty 10

## 2014-03-20 MED ORDER — ONDANSETRON HCL 4 MG/2ML IJ SOLN
INTRAMUSCULAR | Status: AC
  Filled 2014-03-20: qty 2
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
## 2014-03-20 NOTE — Progress Notes (Signed)
[facility]link at bedside, [facility]

## 2014-03-20 NOTE — CV Procedure (Signed)
CARDIAC CATHETERIZATION  REPORT  NAME:  Aaron Curry   MRN: [id number] DOB:  04-06-1971   ADMIT DATE: 03/19/2014 Procedure Date: 03/20/2014  INTERVENTIONAL CARDIOLOGIST: Leonie Man, M.D., MS PRIMARY CARE PROVIDER: Oren Bracket, MD PRIMARY CARDIOLOGIST: Rozann Lesches, MD  PATIENT:  Aaron Curry is a 43 y.o. male with history of CAD, previously followed by Dr. Myriam Forehand in [place] with reported 2 stents placed in 2008 (one to LAD) and followup heart catheterization 2011 not requiring intervention. He has been followed by the [facility] hospital system in [place] with reported stress test in 2013, He has not been seen by cardiologist in 5 years.  He presented to [facility] ER with complaints of unrelenting chest pressure, described as a dull ache, pressure on the left side of his chest, with transient sharp pain while at rest watching television. Symptoms began 4 days ago, causing him to feel weak, with associated nausea. He stayed in bed all day on Tuesday, with some improvement but not complete relief of symptoms. Pain worsened again on Wed, and Thursday. The pain is constant waxing and waning. It is worsened with deep breaths.  He was noted to be profoundly hypertensive. He was seen by Dr. Domenic Polite in the [facility] ER who felt that the symptoms were concerning for Unstable Angina.  PRE-OPERATIVE DIAGNOSIS:    Unstable Angina  Known CAD with PCI to LAD  PROCEDURES PERFORMED:    Left Heart Catheterization with Native Coronary Angiography  via Right Radial Artery   Left Ventriculography  PROCEDURE: The patient was brought to the [facility] in the fasting state and prepped and draped in the usual sterile fashion for Right Radial artery access. A modified Allen's test was performed on the Right wrist demonstrating excellent collateral flow for radial access.   Sterile technique was used including antiseptics, cap, gloves, gown, hand hygiene, mask  and sheet. Skin prep: Chlorhexidine.   Consent: Risks of procedure as well as the alternatives and risks of each were explained to the (patient/caregiver). Consent for procedure obtained.   Time Out: Verified patient identification, verified procedure, site/side was marked, verified correct patient position, special equipment/implants available, medications/allergies/relevent history reviewed, required imaging and test results available. Performed.  Access:   Right Radial Artery: 5 Fr Sheath -  Seldinger Technique (Angiocath Micropuncture Kit)  Radial Cocktail - 10 mL; IV Heparin 3500 Units   Left Heart Catheterization: 5Fr Catheters advanced or exchanged over a long exchange safety J-wire; TIG 4.0 catheter advanced first.  Left & RightCoronary Artery Cineangiography: TIG 4.0 Catheter   LV Hemodynamics (LV Gram): Angled Pigtail  TR Band: 1430  Hours; 17 mL air  FINDINGS:  Hemodynamics:   Central Aortic Pressure / Mean: 142/76/100 mmHg  Left Ventricular Pressure / LVEDP: 141/11/15 mmHg  Left Ventriculography:  EF: ~50-55 %  Wall Motion: Inferoapical Akinesis.  Coronary Anatomy:  Dominance: Right   Left Main: Very short / cloacal vessel that gives of the LAD and Circumflex (very difficult to engage without sub-selecting either LAD or Circumflex) LAD: Normal caliber vessel with a widely patent stent in the midportion. This does appear to be possibly 2 overlapping stents. It gives of a proximal diagonal branch as well as a proximal large septal perforator trunk. The vessel tapers distally down around the apex perfusion the distal third of the inferoapex. Mild luminal irregularities are noted with a 40% distal lesion that has some worsening with suggestion of myocardial bridging.  D1: Moderate caliber vessel,  at least as big as the LAD or branch is covering entire anterolateral wall. Minimal luminal irregularities.  Left Circumflex: Normal caliber (moderate-large) vessel that  gives rise to a very proximal/high obtuse marginal and then courses down in the AV groove terminating as a small posterolateral branch. Minimal luminal irregularities  OM1: Large caliber vessel  (larger than the main circumflex) with a small branch coming off the mid vessel. Branch has an ostial 60-70% stenosis but is a less than 1.5 mm vessel. The OM courses down to the inferolateral wall bifurcating into a small and larger branch. The larger branch then continues almost to the inferoapex.   RCA: Large caliber dominant a weight of one major artery marginal branch comes off the proximal mid vessel. The RCA bifurcates distally into the Right Posterior Descending Artery and the  Right Posterior AV Groove Branch (RPAV). The main RCA has mild diffuse luminal irregularities, but is without significant lesions.  RPDA: Moderate caliber vessel that reaches almost to the apex. There is a focal mid roughly 40% stenosis. Otherwise no significant disease.  RPL Sysytem:The RPAV small moderate caliber vessel that gives off a small AV nodal artery branch before terminating as a small-moderate caliber RPL with several small branches. Minimal little irregularities.  After reviewing the initial angiography, the culprit lesion was thought to be the ostial ~60-70% lesion in the small branch of OM1.  The vessel size & location make this lesion unfavorable for any percutaneous approach.  MEDICATIONS:  Anesthesia:  Local Lidocaine 2 ml  Sedation:  2 mg IV Versed, 50 mcg IV fentanyl ;   Premedication: 4 mg IV Zofran  Omnipaque Contrast: 90 ml  Anticoagulation:  IV Heparin 3500 Units Radial Cocktail: 5 mg Verapamil, 400 mcg NTG, 2 ml 2% Lidocaine in 10 ml NS  PATIENT DISPOSITION:    The patient was transferred to the PACU holding area in a hemodynamicaly stable, chest pain free condition.  The patient tolerated the procedure well, and there were no complications.  EBL:   < 10 ml  The patient was stable  before, during, and after the procedure.  POST-OPERATIVE DIAGNOSIS:    Widely patent stents in the LAD with moderate to severe disease in a small branch of OM1. Otherwise no significant disease.  He does smaller caliber diabetic appearing vessels but mostly diffuse mild luminal irregularities.  The inferoapical hypokinesis/akinesis is likely to result in anterior STEMI, mid wraparound LAD lesion with the infarct affecting most of the inferoapex.  Otherwise preserved LVEF with mildly elevated LVEDP  PLAN OF CARE:  Standard post radial cath care.  Patient would manage medically tonight and we'll consider discharge in the morning on increased medical therapy for possible microvascular angina.    Leonie Man, M.D., M.S. Interventional Cardiologist   Pager # [phone]

## 2014-03-20 NOTE — Progress Notes (Signed)
Inpatient Diabetes Program Recommendations  AACE/ADA: New Consensus Statement on Inpatient Glycemic Control (2013)  Target Ranges:  Prepandial:   less than 140 mg/dL      Peak postprandial:   less than 180 mg/dL (1-2 hours)      Critically ill patients:  140 - 180 mg/dL   Patient has reconnected to his insulin pump. RN called to ask if she should give the resistant correction scale as ordered at 1645. Pt states he has already bolused for his blood sugar. Will talk with patient to assess need for meal coverage, although typically the pump is set up to cover meals as well.  Inpatient Diabetes Program Recommendations Correction (SSI): Please do not use any correction scale while patient is using his insuilin pump. Pt states he corrects his cbg's with his pump. Thank you, Lenor CoffinAnn Cale Decarolis, RN, CNS, Diabetes Coordinator [phone])

## 2014-03-20 NOTE — Progress Notes (Signed)
Inpatient Diabetes Program Recommendations  AACE/ADA: New Consensus Statement on Inpatient Glycemic Control (2013)  Target Ranges:  Prepandial:   less than 140 mg/dL      Peak postprandial:   less than 180 mg/dL (1-2 hours)      Critically ill patients:  140 - 180 mg/dL  Results for Aaron Curry, Aaron Curry (MRN [id number]) as of 03/20/2014 16:05  Ref. Range 03/20/2014 08:56 03/20/2014 15:55  Glucose-Capillary Latest Range: 70-99 mg/dL 725131 (H) 366408 (H)   Diabetes Coordinator follow up on consult for insulin pump.  Per patient's RN Aaron Curry the insulin pump was removed this morning around 10 a.m for cardiac cath procedure.  Concern for DKA bc patient has glucose >400 and N/V per RN.  RN reports patient restarted his insulin pump approximately 10 minutes ago.  Attempt to page attending with no return call.  Alerted RN to concern and she will notify MD.   Thank you  Aaron Curry BSN, RN,CDE Inpatient Diabetes Coordinator [phone] (team pager)

## 2014-03-20 NOTE — Progress Notes (Signed)
Noted patient uses an insulin pump with Novolog as an outpatient for diabetes control. However, there are not any orders for the insulin pump in the chart. Called the patient and he does have his insulin pump connected and is currently using the insulin pump for inpatient glycemic control. Inquired about CBG of 61 mg/dl last night at 09:8121:36 and patient states that it was low because he has not eaten anything since yesterday at lunch. Karel Jarvisalled Nicky, RN and she is actually in the room with the patient and she is in the process of getting the insulin pump contract and flow sheet signed. Entered order for insulin pump for MD to sign so insulin pump information can be documented. Will continue to follow.  Thanks, Orlando PennerMarie Rinaldo Macqueen, RN, MSN, CCRN Diabetes Coordinator Inpatient Diabetes Program [phone] (Team Pager) [phone] ([facility] office) [phone] [facility] office)
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
## 2014-03-20 NOTE — Progress Notes (Addendum)
Bmet drawn after call to lab. Awaiting results. Notified the PA, Earnestine MealingKaty Stern to request a hospitalist consult. Text-page returned and PA states she will make a hospitalist consult to handle potential DKA, nausea, vomiting. Thank you, Lenor CoffinAnn Marqui Formby, RN, CNS, Diabetes Coordinator [phone])  AD 1850. Still awaiting results of Bmet. No hospitalist has responded to this floor who is to be consulted by the PA for diabetes management. AC

## 2014-03-20 NOTE — Progress Notes (Signed)
TR band removed; pressure dsg applied.  Pt continues to do better, not as nauseated and trying to rest/sleep.  Last CBG=257.  Hospitalist at bedside.  After Derinda (Cath lab Tech) removed the last 2cc's of air, I was then able to remove 2cc's more at 1830, then the last 1cc was removed at 1845.  No further bleeding, site is a level 0.

## 2014-03-20 NOTE — Progress Notes (Signed)
Called to assist with TR band removal at 1710.  Pt. Has been nauseated and vomiting several times since my arrival.  Band deflated by 2cc's at 1715, then again at 1745 by 2cc's, then again at 1800 by 2cc's, then again at 1815 by 2cc's for a total of 8cc's removed. Capillary refill is <3 sec.  Rt. Hand is warm and pink in color.  No complaints of numbness or tingling.  RN here to assess TR band at last deflation.  No bleeding at the site at this time.  No hematoma.

## 2014-03-20 NOTE — Progress Notes (Signed)
Hypoglycemic Event  CBG: 59  Treatment: 4oz  Orange juice.  Symptoms: nausea  Follow-up CBG: Time:0830 CBG Result:44 CBG Result after D50 25mls: 131  Possible Reasons for Event: Pt did not eat since yesterday 12 noon.  Comments/MD notified:Dr Pat Patrickhui    Eirik Schueler, Lang SnowNicola B  Remember to initiate Hypoglycemia Order Set & complete
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
## 2014-03-20 NOTE — Progress Notes (Signed)
[facility] PROGRESS NOTE  Aaron HillClyde J Curry YQI:[id number] DOB: [birth year] DOA: 03/19/2014 PCP: No primary provider on file.  Assessment/Plan: CHest pain: Cardiac vs GI vs MSK. HEART score 4. H/o MI s/p cardiac cath w/ stents placed in 2008 and 2011. Troponins 1/3 neg. EKG unchanged from previous. Cycle troponins (1/3 neg), provide  ASA, Morphine, Nitro PRN. Evaluated by cardi[facility].   CKD: Cr 1.7 on admission. Baseline 1.65.  IVF 1/2NS 16900ml/hr.    Hypertension: hypertensive emergency on admission w/ BP on multiple reads 208/120+.States he took his medications this am better control. Continue home Metoprolol and Norvasc, lisinopril. hydralazine PRN.  DM: controlled w/ pump at home. Last A1c 7.9 05/2013. Evaluated by diabetic coordinator and insulin pump contract signed to initiate pump. Some concern given upcoming cath. Pump orders modified per cards. Recommend diabetic coordinator consult after cath.   Insomnia: continue home Trazodone. Home Ambien only PRN   Depression: well controlled.continue home Paxil   GERD:  protonix   Code Status: full Family Communication: none present Disposition Plan: home when ready   Consultants:  cardiology  Procedures:  none  Antibiotics:  none  HPI/Subjective: Reports some improvement in chest pain  Objective: Filed Vitals:   03/21/14 1346  BP: 130/62  Pulse: 53  Temp: 98.3 F (36.8 C)  Resp: 16   No intake or output data in the 24 hours ending 03/23/14 1343 Filed Weights   03/19/14 1729 03/21/14 0500  Weight: 67.132 kg (148 lb) 65.409 kg (144 lb 3.2 oz)    Exam:   General:  Well nourished appears comfortable  Cardiovascular: RRR No MGR No LE edema  Respiratory: normal effort BS coarse no crackles  Abdomen: soft +BS non-tender to palpation  Musculoskeletal: no clubbing or cyanosis   Data Reviewed: Basic Metabolic Panel:  Recent Labs Lab 03/19/14 1744  03/20/14 0335 03/20/14 1810 03/20/14 2114 03/21/14 0522  NA 139 139 134* 134* 135*  K 4.0 3.5* 4.1 4.0 3.9  CL 97 99 95* 96 98  CO2 29 28 24 23 23   GLUCOSE 193* 131* 263* 205* 259*  BUN 21 22 24* 23 26*  CREATININE 1.74* 1.69* 1.54* 1.48* 1.79*  CALCIUM 9.9 8.8 9.4 8.7 8.5   Liver Function Tests:  Recent Labs Lab 03/19/14 1744 03/20/14 0335  AST 17 12  ALT 7 6  ALKPHOS 137* 101  BILITOT 0.6 0.3  PROT 8.2 6.3  ALBUMIN 4.4 3.4*   No results found for this basename: LIPASE, AMYLASE,  in the last 168 hours No results found for this basename: AMMONIA,  in the last 168 hours CBC:  Recent Labs Lab 03/19/14 1744  WBC 7.0  NEUTROABS 4.9  HGB 14.9  HCT 41.2  MCV 92.6  PLT 280   Cardiac Enzymes:  Recent Labs Lab 03/19/14 1744 03/19/14 1941 03/20/14 0037 03/20/14 0335  TROPONINI <0.30 <0.30 <0.30 <0.30   BNP (last 3 results) No results found for this basename: PROBNP,  in the last 8760 hours CBG:  Recent Labs Lab 03/20/14 1948 03/21/14 0050 03/21/14 0539 03/21/14 0759 03/21/14 1209  GLUCAP 218* 227* 275* 190* 140*    No results found for this or any previous visit (from the past 240 hour(s)).   Studies: No results found.  Scheduled Meds:  Continuous Infusions:   Principal Problem:   Unstable angina Active Problems:   Accelerated hypertension   CAD- LAD stent '08, cath 2011 and 03/20/14 OK- SVD   DM type 1  causing renal disease   Chronic renal disease, stage III   Diabetic gastroparesis   Dyslipidemia    Time spent: 35 minutes    [facility] M  [facility] Pager [phone]. If 7PM-7AM, please contact night-coverage at [email or web address], password [facility] 03/23/2014, 1:43 PM  LOS: 2 days

## 2014-03-20 NOTE — Progress Notes (Addendum)
Came to see the patient to assess his insulin pump.  It is an animas ping.  Pt very able to use his pump at this time although he is vomiting (clear mucous). He states his basal settings are 0.725 units per hour times 24 hrs.  Bolus settings are: Meal coverage: Insulin to carb ratio is 1 unit per 15 grams cho                                Sensitivity Factor is set at 1 unit per 40 mg/dL over target goal. Pt is unsure of target goal but states his glucose primarily stays in a range of 100-120 mg/dL. Pump is presently running-we are awaiting Bmet results to assess for acidosis.  Thank you, Lenor CoffinAnn Chaquetta Schlottman, RN, CNS, Diabetes Coordinator [phone])  Ad Just called the lab regarding the Bmet draw and results. Lab states that no Bmet has been drawn this afternoon. Ordered another stat. Pt needs a hospitalist consult-will call PA.  [facility] Sent text page to Dr. Clifton JamesMcAlhany to request hospitalist consult. He returned text page- he is not presenty on call. Reviewed Amion with him and text paged PA Venetia MaxonStern. AC

## 2014-03-20 NOTE — Progress Notes (Signed)
Call.R.N. regarding hyperglycemia  The patient's insulin pump was removed this morning approximately 10 AM for cardiac catheterization. It has been resumed since he arrived on the floor post-cath.  His blood sugars were significantly elevated, at one point greater than 400. The last reading was 375. He has had problems with nausea and vomiting. His TR band was reinflated because he started bleeding because of the nausea and vomiting.  Added sliding scale insulin. Recommended that the patient continue his insulin pump at a basal rate and a sliding scale be used for meal and bedtime coverage.  Use when necessary medications for nausea and vomiting.  Will call the cath lab to help with TR band removal.  He did not have chest pain or shortness of breath, had disease in a small OM branch at cath, medical therapy recommended.   We will use normal saline 100 cc per hour x1 L for hydration post-cath.  Continue other therapies and call for changes in patient condition.  Theodore DemarkRhonda Javaris Wigington, PA-C 03/20/2014 4:43 PM Beeper [phone]

## 2014-03-20 NOTE — Progress Notes (Addendum)
B-met results back-no results indicating DKA. Notified Dr Reymundo Poll. She requested RN bolus his NS IV fluids.  Pt will need some dextrose added to his IV fluids if he is not able to drink or eat any source of carbohydrate/glucose. Pt to continue on insulin pump and bolus as necessary. MD on her way over to the hospital now.Thank you, Rosita Kea, RN, CNS, Diabetes Coordinator [phone]) Aneth: Pt states he has family member bring him his insulin pump supplies in the am. Insertion site is clear and pump is functioning properly. AC

## 2014-03-20 NOTE — Progress Notes (Signed)
Received order to give nitropaste for chest pain not relieved by 2mg  morphine.  Notified by cardiologist [facility].  Paperwork completed, [facility] michael at [facility].  VS within normal limits for pt.  Pt running sinus bradcardia at 47.  Family at bedside.  2l/min [place] placed on pt to reduce workload of heart.  Pt o2 sat 100% on room air.  Report called to Consuella LoseElaine, RN on [facility].

## 2014-03-20 NOTE — Interval H&P Note (Signed)
History and Physical Interval Note:  03/20/2014 1:26 PM  Aaron HillClyde J Curry  has presented today for surgery, with the diagnosis of unstable angina. The various methods of treatment have been discussed with the patient and family. After consideration of risks, benefits and other options for treatment, the patient has consented to  Procedure(s): LEFT HEART CATHETERIZATION WITH CORONARY ANGIOGRAM (N/A) plus minus PCI as a surgical intervention .  The patient's history has been reviewed, patient examined, no change in status, stable for surgery.  I have reviewed the patient's chart and labs.  Questions were answered to the patient's satisfaction.     HARDING,DAVID W  Cath Lab Visit (complete for each Cath Lab visit)  Clinical Evaluation Leading to the Procedure:   ACS: Yes.    Non-ACS:    Anginal Classification: CCS IV  Anti-ischemic medical therapy: Minimal Therapy (1 class of medications)  Non-Invasive Test Results: No non-invasive testing performed  Prior CABG: No previous CABG

## 2014-03-21 ENCOUNTER — Inpatient Hospital Stay (HOSPITAL_COMMUNITY): Payer: Medicare Other

## 2014-03-21 DIAGNOSIS — K3184 Gastroparesis: Secondary | ICD-10-CM

## 2014-03-21 DIAGNOSIS — N183 Chronic kidney disease, stage 3 unspecified: Secondary | ICD-10-CM | POA: Diagnosis present

## 2014-03-21 DIAGNOSIS — E1143 Type 2 diabetes mellitus with diabetic autonomic (poly)neuropathy: Secondary | ICD-10-CM | POA: Diagnosis present

## 2014-03-21 DIAGNOSIS — E785 Hyperlipidemia, unspecified: Secondary | ICD-10-CM | POA: Diagnosis present

## 2014-03-21 LAB — BASIC METABOLIC PANEL
Anion gap: 14 (ref 5–15)
BUN: 26 mg/dL — ABNORMAL HIGH (ref 6–23)
CO2: 23 mEq/L (ref 19–32)
CREATININE: 1.79 mg/dL — AB (ref 0.50–1.35)
Calcium: 8.5 mg/dL (ref 8.4–10.5)
Chloride: 98 mEq/L (ref 96–112)
GFR, EST AFRICAN AMERICAN: 51 mL/min — AB (ref >90)
GFR, EST NON AFRICAN AMERICAN: 44 mL/min — AB (ref >90)
GLUCOSE: 259 mg/dL — AB (ref 70–99)
Potassium: 3.9 mEq/L (ref 3.7–5.3)
Sodium: 135 mEq/L — ABNORMAL LOW (ref 137–147)

## 2014-03-21 LAB — GLUCOSE, CAPILLARY
GLUCOSE-CAPILLARY: 275 mg/dL — AB (ref 70–99)
Glucose-Capillary: 140 mg/dL — ABNORMAL HIGH (ref 70–99)
Glucose-Capillary: 190 mg/dL — ABNORMAL HIGH (ref 70–99)
Glucose-Capillary: 227 mg/dL — ABNORMAL HIGH (ref 70–99)

## 2014-03-21 LAB — HEMOGLOBIN A1C
HEMOGLOBIN A1C: 8.3 % — AB (ref <5.7)
Mean Plasma Glucose: 192 mg/dL — ABNORMAL HIGH (ref <117)

## 2014-03-21 MED ORDER — NITROGLYCERIN 0.4 MG SL SUBL: 0.4000 mg | SUBLINGUAL_TABLET | SUBLINGUAL | Status: AC | PRN

## 2014-03-21 MED ORDER — HYDRALAZINE HCL 25 MG PO TABS
25.0000 mg | ORAL_TABLET | Freq: Three times a day (TID) | ORAL | Status: DC
  Administered 2014-03-21: 25 mg via ORAL
  Filled 2014-03-21 (×3): qty 1

## 2014-03-21 MED ORDER — LISINOPRIL 10 MG PO TABS: 10.0000 mg | ORAL_TABLET | Freq: Every day | ORAL | Status: AC

## 2014-03-21 MED ORDER — INSULIN ASPART 100 UNIT/ML ~~LOC~~ SOLN
0.0000 [IU] | SUBCUTANEOUS | Status: DC
  Administered 2014-03-21: 0.5 [IU] via SUBCUTANEOUS

## 2014-03-21 MED ORDER — HYDRALAZINE HCL 25 MG PO TABS: 25.0000 mg | ORAL_TABLET | Freq: Three times a day (TID) | ORAL | Status: AC

## 2014-03-21 MED ORDER — LISINOPRIL 10 MG PO TABS
10.0000 mg | ORAL_TABLET | Freq: Every day | ORAL | Status: DC
  Administered 2014-03-21: 10 mg via ORAL
  Filled 2014-03-21: qty 1

## 2014-03-21 MED ORDER — INSULIN ASPART 100 UNIT/ML ~~LOC~~ SOLN: 0.0000 [IU] | Freq: Three times a day (TID) | SUBCUTANEOUS | Status: DC

## 2014-03-21 MED ORDER — ISOSORBIDE MONONITRATE ER 30 MG PO TB24: 30.0000 mg | ORAL_TABLET | Freq: Every day | ORAL | Status: DC

## 2014-03-21 NOTE — Progress Notes (Signed)
Consult Note                                            Patient Demographics  Aaron Curry, is a 43 y.o. male, DOB - February 16, 1971, ZOX:[id number]  Admit date - 03/19/2014   Admitting Physician Dolores Patty, MD  Outpatient Primary MD for the patient is No primary provider on file.  LOS - 2   Chief Complaint  Patient presents with  . Chest Pain        Subjective:   Aaron Curry today has, No headache, No chest pain, No abdominal pain - No Nausea, No new weakness tingling or numbness, No Cough - SOB.   Assessment & Plan    1. DM type I with diabetic gastroparesis.   For nausea vomiting. Continue Reglan before meals, nausea vomiting symptoms almost completely resolved and he is tolerating diet, as needed IV Zofran.  For DM type I. Continue his insulin pump, for CBGs over 200 we'll add custom dose q. a.c. sliding scale.   Lab Results  Component Value Date   HGBA1C 8.1* 03/20/2014     CBG (last 3)   Recent Labs  03/21/14 0050 03/21/14 0539 03/21/14 0759  GLUCAP 227* 275* 190*     3. Chr kidney disease stage IV. Baseline creatinine between 1.6 and 1.8. Currently at baseline.    4. Essential hypertension. Blood pressure medications adjusted, I have dropped his lisinopril and added hydralazine to beta blocker. On his left heart cath EF is 50-55%. Creatinine slightly higher than yesterday post IV dye exposure from left heart cath. We'll lower it is edematous for now.    5. Chest pain. We'll defer to primary team which is cardiology.    6. GERD. On PPI.      Medications  Scheduled Meds: . amLODipine  10 mg Oral Daily  . heparin  5,000 Units Subcutaneous 3 times per day  . hydrALAZINE  25 mg Oral 3 times per day  . insulin aspart  0-9 Units Subcutaneous  TID WC  . insulin pump   Subcutaneous TID AC, HS, 0200  . lisinopril  10 mg Oral Daily  . metoCLOPramide  5 mg Oral TID AC  . metoprolol  50 mg Oral BID  . nitroGLYCERIN  0.5 inch Topical 4 times per day  . pantoprazole  40 mg Oral Daily  . PARoxetine  60 mg Oral q morning - 10a  . senna  1 tablet Oral BID  . simvastatin  10 mg Oral QPM  . sodium chloride  3 mL Intravenous Q12H  . traZODone  100 mg Oral QHS   Continuous Infusions:  PRN Meds:.acetaminophen, acetaminophen, gi cocktail, hydrALAZINE, metoCLOPramide (REGLAN) injection, morphine injection, ondansetron (ZOFRAN) IV, zolpidem  DVT Prophylaxis  Heparin   Lab Results  Component Value Date   PLT 280 03/19/2014    Antibiotics     Anti-infectives   None          Objective:   Filed Vitals:   03/20/14 1800 03/20/14 1846 03/20/14 2100  03/21/14 0500  BP: 153/81 138/81 138/81 145/70  Pulse: 61 60 68 63  Temp:   98.1 F (36.7 C) 98.2 F (36.8 C)  TempSrc:   Oral Oral  Resp:   18 18  Height:      Weight:    65.409 kg (144 lb 3.2 oz)  SpO2:   97% 96%    Wt Readings from Last 3 Encounters:  03/21/14 65.409 kg (144 lb 3.2 oz)  03/21/14 65.409 kg (144 lb 3.2 oz)  05/22/13 69 kg (152 lb 1.9 oz)     Intake/Output Summary (Last 24 hours) at 03/21/14 0957 Last data filed at 03/20/14 1900  Gross per 24 hour  Intake    240 ml  Output    500 ml  Net   -260 ml     Physical Exam  Awake Alert, Oriented X 3, No new F.N deficits, Normal affect [place].AT,PERRAL Supple Neck,No JVD, No cervical lymphadenopathy appriciated.  Symmetrical Chest wall movement, Good air movement bilaterally, CTAB RRR,No Gallops,Rubs or new Murmurs, No Parasternal Heave +ve B.Sounds, Abd Soft, No tenderness, No organomegaly appriciated, No rebound - guarding or rigidity. No Cyanosis, Clubbing or edema, No new Rash or bruise      Data Review   Micro Results No results found for this or any previous visit (from the past 240  hour(s)).  Radiology Reports Dg Chest Port 1 View  03/21/2014   CLINICAL DATA:  Acute onset chest pain radiating to the left shoulder and elbow with associated nausea and diaphoresis. History of coronary artery disease.  EXAM: PORTABLE CHEST - 1 VIEW  COMPARISON:  Chest CT 05/21/2013 and radiograph 05/21/2013  FINDINGS: The cardiomediastinal silhouette is within normal limits. Increased lucency in the left upper lobe with diminished lung markings and tubular density extending from the hilum is consistent with previously described bronchial atresia. There is no evidence airspace consolidation, pulmonary edema, pleural effusion, or definite pneumothorax. Old left-sided rib fractures are again noted.  IMPRESSION: No active disease.   Electronically Signed   By: Sebastian Ache   On: 03/21/2014 07:40     CBC  Recent Labs Lab 03/19/14 1744  WBC 7.0  HGB 14.9  HCT 41.2  PLT 280  MCV 92.6  MCH 33.5  MCHC 36.2*  RDW 12.8  LYMPHSABS 1.5  MONOABS 0.4  EOSABS 0.1  BASOSABS 0.0    Chemistries   Recent Labs Lab 03/19/14 1744 03/20/14 0335 03/20/14 1810 03/20/14 2114 03/21/14 0522  NA 139 139 134* 134* 135*  K 4.0 3.5* 4.1 4.0 3.9  CL 97 99 95* 96 98  CO2 29 28 24 23 23   GLUCOSE 193* 131* 263* 205* 259*  BUN 21 22 24* 23 26*  CREATININE 1.74* 1.69* 1.54* 1.48* 1.79*  CALCIUM 9.9 8.8 9.4 8.7 8.5  AST 17 12  --   --   --   ALT 7 6  --   --   --   ALKPHOS 137* 101  --   --   --   BILITOT 0.6 0.3  --   --   --    ------------------------------------------------------------------------------------------------------------------ estimated creatinine clearance is 47.5 ml/min (by C-G formula based on Cr of 1.79). ------------------------------------------------------------------------------------------------------------------  Recent Labs  03/20/14 0335  HGBA1C 8.1*   ------------------------------------------------------------------------------------------------------------------ No  results found for this basename: CHOL, HDL, LDLCALC, TRIG, CHOLHDL, LDLDIRECT,  in the last 72 hours ------------------------------------------------------------------------------------------------------------------ No results found for this basename: TSH, T4TOTAL, FREET3, T3FREE, THYROIDAB,  in the last 72 hours ------------------------------------------------------------------------------------------------------------------  No results found for this basename: VITAMINB12, FOLATE, FERRITIN, TIBC, IRON, RETICCTPCT,  in the last 72 hours  Coagulation profile  Recent Labs Lab 03/19/14 1744  INR 0.95    No results found for this basename: DDIMER,  in the last 72 hours  Cardiac Enzymes  Recent Labs Lab 03/19/14 1941 03/20/14 0037 03/20/14 0335  TROPONINI <0.30 <0.30 <0.30   ------------------------------------------------------------------------------------------------------------------ No components found with this basename: POCBNP,      Time Spent in minutes  35   Taris Galindo K M.D on 03/21/2014 at 9:57 AM  Between 7am to 7pm - Pager - [phone]  After 7pm go to [email or web address] - password [email or web address]  And look for the night coverage person covering for me after hours  [facility] Office  [phone]   **Disclaimer: This note may have been dictated with voice recognition software. Similar sounding words can inadvertently be transcribed and this note may contain transcription errors which may not have been corrected upon publication of note.**

## 2014-03-21 NOTE — Discharge Instructions (Signed)
Angina Pectoris  Angina pectoris is extreme discomfort in your chest, neck, or arm. Your doctor may call it just angina. It is caused by a lack of oxygen to your heart wall. It may feel like tightness or heavy pressure. It may feel like a crushing or squeezing pain. Some people say it feels like gas. It may go down your shoulders, back, and arms. Some people have symptoms other than pain. These include:  · Tiredness.  · Shortness of breath.  · Cold sweats.  · Feeling sick to your stomach (nausea).  There are four types of angina:  · Stable angina. This type often lasts the same amount of time each time it happens. Activity, stress, or excitement can bring it on. It often gets better after taking a medicine called nitroglycerin. This goes under your tongue.  · Unstable angina. This type can happen when you are not active or even during sleep. It can suddenly get worse or happen more often. It may not get better after taking the special medicine. It can last up to 30 minutes.  · Microvascular angina. This type is more common in women. It may be more severe or last longer than other types.  · Prinzmetal angina. This type often happens when you are not active or in the early morning hours.  HOME CARE   · Only take medicines as told by your doctor.  · Stay active or exercise more as told by your doctor.  · Limit very hard activity as told by your doctor.  · Limit heavy lifting as told by your doctor.  · Keep a healthy weight.  · Learn about and eat foods that are healthy for your heart.  · Do not use any tobacco such as cigarettes, chewing tobacco, or e-cigarettes.  GET HELP RIGHT AWAY IF:   · You have chest, neck, deep shoulder, or arm pain or discomfort that lasts more than a few minutes.  · You have chest, neck, deep shoulder, or arm pain or discomfort that goes away and comes back over and over again.  · You have heavy sweating that seems to happen for no reason.  · You have shortness of breath or trouble  breathing.  · Your angina does not get better after a few minutes of rest.  · Your angina does not get better after you take nitroglycerin medicine.  These can all be symptoms of a heart attack. Get help right away. Call your local emergency service (911 in [place] Do not  drive yourself to the hospital. Do not  wait to for your symptoms to go away.  MAKE SURE YOU:   · Understand these instructions.  · Will watch your condition.  · Will get help right away if you are not doing well or get worse.  Document Released: 11/22/2007 Document Revised: 06/10/2013 Document Reviewed: 10/07/2013  ExitCare® Patient Information ©2015 [employer]. This information is not intended to replace advice given to you by your health care provider. Make sure you discuss any questions you have with your health care provider.

## 2014-03-21 NOTE — Discharge Summary (Signed)
Patient ID: Aaron Curry,  MRN: [id number], DOB/AGE: 1971-05-04 43 y.o.  Admit date: 03/19/2014 Discharge date: 03/21/2014  Primary Care Provider: No primary provider on file. Primary Cardiologist: Dr Diona Browner (new)  Discharge Diagnoses Principal Problem:   Unstable angina Active Problems:   CAD- LAD stent '08, cath 2011 and 03/20/14 OK- SVD   Accelerated hypertension   DM type 1 causing renal disease   Chronic renal disease, stage III   Diabetic gastroparesis   Dyslipidemia    Procedures: Cardiac cath 03/20/14   Hospital Course:  Aaron Curry is a 43 y.o.male with history of CAD, previously followed by Dr. Ruthann Cancer in [place] with reported 2 stents placed in 2008 (one to LAD) and followup heart catheterization 2011 not requiring intervention. He has been followed by the [facility] [place] with reported stress test in 2013, He has not been seen by cardiologist in 5 years. He presented to the ER at [facility] 03/19/14 with chest pain worrisome for [place]. On arrival to ER he was found to by hypertensive with BP 218/120. UDS positive for opiates and THC. Creatinine 1.74, BG 193. EKG NSR with T-wave inversion anterolateral. He was treated with NTG, and started on NTG gtt, morphine, ASA, and ativan. He was seen by Dr Diona Browner and transported to [facility] for further evaluation. He ruled out for an MI. Cath done 03/20/14 revealed patent LAD stents with residual small vessel diabetic CAD. Plan is for medical Rx. His ACE was decreased, Apresoline and Imdur added. He was seen in consult by the Hospitalist service for help with his diabetes while he was hospitalized. Dr Anne Fu feels he can be discharged 03/21/14. We will arrange follow up with Dr Diona Browner in [place]. He'll need a BMP at follow up.    Discharge Vitals:  Blood pressure 130/62, pulse 53, temperature 98.3 F (36.8 C), temperature source Oral, resp. rate 16, height 5\' 6"  (1.676 m), weight 144 lb 3.2 oz (65.409 kg), SpO2 98.00%.     Labs: Results for orders placed during the hospital encounter of 03/19/14 (from the past 24 hour(s))  GLUCOSE, CAPILLARY     Status: Abnormal   Collection Time    03/20/14  3:55 PM      Result Value Ref Range   Glucose-Capillary 408 (*) 70 - 99 mg/dL  GLUCOSE, CAPILLARY     Status: Abnormal   Collection Time    03/20/14  4:13 PM      Result Value Ref Range   Glucose-Capillary 379 (*) 70 - 99 mg/dL  GLUCOSE, CAPILLARY     Status: Abnormal   Collection Time    03/20/14  4:44 PM      Result Value Ref Range   Glucose-Capillary 322 (*) 70 - 99 mg/dL  GLUCOSE, CAPILLARY     Status: Abnormal   Collection Time    03/20/14  5:58 PM      Result Value Ref Range   Glucose-Capillary 257 (*) 70 - 99 mg/dL  BASIC METABOLIC PANEL     Status: Abnormal   Collection Time    03/20/14  6:10 PM      Result Value Ref Range   Sodium 134 (*) 137 - 147 mEq/L   Potassium 4.1  3.7 - 5.3 mEq/L   Chloride 95 (*) 96 - 112 mEq/L   CO2 24  19 - 32 mEq/L   Glucose, Bld 263 (*) 70 - 99 mg/dL   BUN 24 (*) 6 - 23 mg/dL   Creatinine,  Ser 1.54 (*) 0.50 - 1.35 mg/dL   Calcium 9.4  8.4 - 40.9 mg/dL   GFR calc non Af Amer 53 (*) >90 mL/min   GFR calc Af Amer 62 (*) >90 mL/min   Anion gap 15  5 - 15  GLUCOSE, CAPILLARY     Status: Abnormal   Collection Time    03/20/14  7:48 PM      Result Value Ref Range   Glucose-Capillary 218 (*) 70 - 99 mg/dL  BASIC METABOLIC PANEL     Status: Abnormal   Collection Time    03/20/14  9:14 PM      Result Value Ref Range   Sodium 134 (*) 137 - 147 mEq/L   Potassium 4.0  3.7 - 5.3 mEq/L   Chloride 96  96 - 112 mEq/L   CO2 23  19 - 32 mEq/L   Glucose, Bld 205 (*) 70 - 99 mg/dL   BUN 23  6 - 23 mg/dL   Creatinine, Ser 8.11 (*) 0.50 - 1.35 mg/dL   Calcium 8.7  8.4 - 91.4 mg/dL   GFR calc non Af Amer 56 (*) >90 mL/min   GFR calc Af Amer 65 (*) >90 mL/min   Anion gap 15  5 - 15  GLUCOSE, CAPILLARY     Status: Abnormal   Collection Time    03/21/14 12:50 AM       Result Value Ref Range   Glucose-Capillary 227 (*) 70 - 99 mg/dL  BASIC METABOLIC PANEL     Status: Abnormal   Collection Time    03/21/14  5:22 AM      Result Value Ref Range   Sodium 135 (*) 137 - 147 mEq/L   Potassium 3.9  3.7 - 5.3 mEq/L   Chloride 98  96 - 112 mEq/L   CO2 23  19 - 32 mEq/L   Glucose, Bld 259 (*) 70 - 99 mg/dL   BUN 26 (*) 6 - 23 mg/dL   Creatinine, Ser 7.82 (*) 0.50 - 1.35 mg/dL   Calcium 8.5  8.4 - 95.6 mg/dL   GFR calc non Af Amer 44 (*) >90 mL/min   GFR calc Af Amer 51 (*) >90 mL/min   Anion gap 14  5 - 15  GLUCOSE, CAPILLARY     Status: Abnormal   Collection Time    03/21/14  5:39 AM      Result Value Ref Range   Glucose-Capillary 275 (*) 70 - 99 mg/dL  GLUCOSE, CAPILLARY     Status: Abnormal   Collection Time    03/21/14  7:59 AM      Result Value Ref Range   Glucose-Capillary 190 (*) 70 - 99 mg/dL   Comment 1 Documented in Chart     Comment 2 Notify RN    GLUCOSE, CAPILLARY     Status: Abnormal   Collection Time    03/21/14 12:09 PM      Result Value Ref Range   Glucose-Capillary 140 (*) 70 - 99 mg/dL   Comment 1 Documented in Chart     Comment 2 Notify RN      Disposition:      Follow-up Information   Follow up with Nona Dell, MD. (office will call you )    Specialty:  Cardiology   Contact information:   [address] [place] [address] [phone]       Discharge Medications:    Medication List    STOP taking these  medications       promethazine 25 MG tablet  Commonly known as:  PHENERGAN     topiramate 25 MG tablet  Commonly known as:  TOPAMAX      TAKE these medications       amLODipine 10 MG tablet  Commonly known as:  NORVASC  Take 10 mg by mouth daily.     cholecalciferol 1000 UNITS tablet  Commonly known as:  VITAMIN D  Take 1,000 Units by mouth daily.     fish oil-omega-3 fatty acids 1000 MG capsule  Take 2 g by mouth 2 (two) times daily.     hydrALAZINE 25 MG tablet  Commonly known as:   APRESOLINE  Take 1 tablet (25 mg total) by mouth every 8 (eight) hours.     insulin pump Soln  Inject 1 each into the skin continuous.     isosorbide mononitrate 30 MG 24 hr tablet  Commonly known as:  IMDUR  Take 1 tablet (30 mg total) by mouth daily.     lisinopril 10 MG tablet  Commonly known as:  PRINIVIL,ZESTRIL  Take 1 tablet (10 mg total) by mouth daily.     magnesium oxide 400 MG tablet  Commonly known as:  MAG-OX  Take 400 mg by mouth daily.     metoCLOPramide 5 MG tablet  Commonly known as:  REGLAN  Take 1 tablet (5 mg total) by mouth 3 (three) times daily before meals.     metoprolol 50 MG tablet  Commonly known as:  LOPRESSOR  Take 1 tablet (50 mg total) by mouth 2 (two) times daily.     nitroGLYCERIN 0.4 MG SL tablet  Commonly known as:  NITROSTAT  Place 1 tablet (0.4 mg total) under the tongue every 5 (five) minutes as needed for chest pain (severe chest pain or pressure).     pantoprazole 40 MG tablet  Commonly known as:  PROTONIX  Take 1 tablet (40 mg total) by mouth daily.     PARoxetine 40 MG tablet  Commonly known as:  PAXIL  Take 60 mg by mouth every morning.     polyvinyl alcohol 1.4 % ophthalmic solution  Commonly known as:  LIQUIFILM TEARS  Place 1 drop into both eyes as needed for dry eyes.     simvastatin 20 MG tablet  Commonly known as:  ZOCOR  Take 10 mg by mouth every evening.     traZODone 100 MG tablet  Commonly known as:  DESYREL  Take 100 mg by mouth at bedtime.     zolpidem 10 MG tablet  Commonly known as:  AMBIEN  Take 10 mg by mouth at bedtime.         Duration of Discharge Encounter: Greater than 30 minutes including physician time.  Jolene ProvostSigned, Travelle Mcclimans PA-C 03/21/2014 2:40 PM

## 2014-03-21 NOTE — Progress Notes (Signed)
   Cardiology: Dr. Diona BrownerMcDowell Subjective:  No further CP. No further nausea.   Objective:  Vital Signs in the last 24 hours: Temp:  [98.1 F (36.7 C)-98.2 F (36.8 C)] 98.2 F (36.8 C) (10/03 0500) Pulse Rate:  [54-68] 57 (10/03 1013) Resp:  [16-18] 18 (10/03 0500) BP: (138-200)/(68-108) 154/68 mmHg (10/03 1013) SpO2:  [94 %-97 %] 96 % (10/03 0500) Weight:  [144 lb 3.2 oz (65.409 kg)] 144 lb 3.2 oz (65.409 kg) (10/03 0500)  Intake/Output from previous day: 10/02 0701 - 10/03 0700 In: 480 [P.O.:240; I.V.:240] Out: 500 [Urine:500]   Physical Exam: General: Well developed, well nourished, in no acute distress. Head:  Normocephalic and atraumatic. Lungs: Clear to auscultation and percussion. Heart: Normal S1 and S2.  No murmur, rubs or gallops.  Abdomen: soft, non-tender, positive bowel sounds.  Extremities: No clubbing or cyanosis. No edema. Cath site intact.  Neurologic: Alert and oriented x 3.    Lab Results:  Recent Labs  03/19/14 1744  WBC 7.0  HGB 14.9  PLT 280    Recent Labs  03/20/14 2114 03/21/14 0522  NA 134* 135*  K 4.0 3.9  CL 96 98  CO2 23 23  GLUCOSE 205* 259*  BUN 23 26*  CREATININE 1.48* 1.79*    Recent Labs  03/20/14 0037 03/20/14 0335  TROPONINI <0.30 <0.30   Hepatic Function Panel  Recent Labs  03/20/14 0335  PROT 6.3  ALBUMIN 3.4*  AST 12  ALT 6  ALKPHOS 101  BILITOT 0.3    Imaging: Dg Chest Port 1 View  03/21/2014   CLINICAL DATA:  Acute onset chest pain radiating to the left shoulder and elbow with associated nausea and diaphoresis. History of coronary artery disease.  EXAM: PORTABLE CHEST - 1 VIEW  COMPARISON:  Chest CT 05/21/2013 and radiograph 05/21/2013  FINDINGS: The cardiomediastinal silhouette is within normal limits. Increased lucency in the left upper lobe with diminished lung markings and tubular density extending from the hilum is consistent with previously described bronchial atresia. There is no evidence  airspace consolidation, pulmonary edema, pleural effusion, or definite pneumothorax. Old left-sided rib fractures are again noted.  IMPRESSION: No active disease.   Electronically Signed   By: Sebastian AcheAllen  Grady   On: 03/21/2014 07:40   Personally viewed.   Telemetry: no adverse rhythms Personally viewed.  Cardiac Studies:  Cath reviewed, reassuring, patent stents.  Assessment/Plan:  Active Problems:   Chest pain   Unstable angina   Left-sided weakness   DM type 1 causing renal disease  -OK for DC -CP possible from GI source (diabetic gastroparesis) -Continue PPI -He states he was nervous because of prior MI.  -CKD - Creat 1.49-1.7 - mildly increased today. Suggest BMET in one week in follow up with PCP/ Dr. Diona BrownerMcDowell.  OK with lower dose ACE 10mg , lisinopril  until BMET checked.  -BP is mildly elevated this AM. DC home on hydralazine, amlodipine, metoprolol.    Chamia Schmutz 03/21/2014, 12:43 PM

## 2014-03-22 NOTE — Discharge Summary (Signed)
Personally seen and examined. Agree with above. Norina Cowper, MD  

## 2014-03-23 NOTE — Progress Notes (Signed)
Agree with assessment and plan as per above. Pt presents with chest pains with Cardiology recs for cath. [facility].

## 2014-05-28 ENCOUNTER — Encounter (HOSPITAL_COMMUNITY): Payer: Self-pay | Admitting: Cardiology

## 2014-10-03 ENCOUNTER — Inpatient Hospital Stay (HOSPITAL_COMMUNITY)
Admission: EM | Admit: 2014-10-03 | Discharge: 2014-10-05 | DRG: 065 | Disposition: A | Discharge status: Home / Self Care | Payer: Medicare Other | Attending: Neurology | Admitting: Neurology

## 2014-10-03 ENCOUNTER — Emergency Department (HOSPITAL_COMMUNITY): Payer: Medicare Other

## 2014-10-03 DIAGNOSIS — I252 Old myocardial infarction: Secondary | ICD-10-CM

## 2014-10-03 DIAGNOSIS — F603 Borderline personality disorder: Secondary | ICD-10-CM | POA: Diagnosis present

## 2014-10-03 DIAGNOSIS — Z9282 Status post administration of tPA (rtPA) in a different facility within the last 24 hours prior to admission to current facility: Secondary | ICD-10-CM | POA: Diagnosis not present

## 2014-10-03 DIAGNOSIS — Z794 Long term (current) use of insulin: Secondary | ICD-10-CM | POA: Diagnosis not present

## 2014-10-03 DIAGNOSIS — G8191 Hemiplegia, unspecified affecting right dominant side: Secondary | ICD-10-CM | POA: Diagnosis present

## 2014-10-03 DIAGNOSIS — Z7982 Long term (current) use of aspirin: Secondary | ICD-10-CM | POA: Diagnosis not present

## 2014-10-03 DIAGNOSIS — Z9861 Coronary angioplasty status: Secondary | ICD-10-CM

## 2014-10-03 DIAGNOSIS — F4310 Post-traumatic stress disorder, unspecified: Secondary | ICD-10-CM | POA: Diagnosis not present

## 2014-10-03 DIAGNOSIS — Z87891 Personal history of nicotine dependence: Secondary | ICD-10-CM | POA: Diagnosis not present

## 2014-10-03 DIAGNOSIS — E1021 Type 1 diabetes mellitus with diabetic nephropathy: Secondary | ICD-10-CM | POA: Diagnosis not present

## 2014-10-03 DIAGNOSIS — I129 Hypertensive chronic kidney disease with stage 1 through stage 4 chronic kidney disease, or unspecified chronic kidney disease: Secondary | ICD-10-CM | POA: Diagnosis not present

## 2014-10-03 DIAGNOSIS — N183 Chronic kidney disease, stage 3 (moderate): Secondary | ICD-10-CM | POA: Diagnosis present

## 2014-10-03 DIAGNOSIS — I10 Essential (primary) hypertension: Secondary | ICD-10-CM | POA: Diagnosis not present

## 2014-10-03 DIAGNOSIS — I639 Cerebral infarction, unspecified: Principal | ICD-10-CM | POA: Diagnosis present

## 2014-10-03 DIAGNOSIS — I672 Cerebral atherosclerosis: Secondary | ICD-10-CM | POA: Diagnosis not present

## 2014-10-03 DIAGNOSIS — Z9889 Other specified postprocedural states: Secondary | ICD-10-CM

## 2014-10-03 DIAGNOSIS — M6289 Other specified disorders of muscle: Secondary | ICD-10-CM | POA: Diagnosis not present

## 2014-10-03 DIAGNOSIS — I2510 Atherosclerotic heart disease of native coronary artery without angina pectoris: Secondary | ICD-10-CM | POA: Diagnosis present

## 2014-10-03 DIAGNOSIS — R531 Weakness: Secondary | ICD-10-CM | POA: Diagnosis present

## 2014-10-03 DIAGNOSIS — E785 Hyperlipidemia, unspecified: Secondary | ICD-10-CM | POA: Diagnosis present

## 2014-10-03 DIAGNOSIS — E1029 Type 1 diabetes mellitus with other diabetic kidney complication: Secondary | ICD-10-CM | POA: Diagnosis present

## 2014-10-03 DIAGNOSIS — I6350 Cerebral infarction due to unspecified occlusion or stenosis of unspecified cerebral artery: Secondary | ICD-10-CM | POA: Diagnosis not present

## 2014-10-03 LAB — DIFFERENTIAL
Basophils Absolute: 0.1 10*3/uL (ref 0.0–0.1)
Basophils Relative: 1 % (ref 0–1)
Eosinophils Absolute: 0.1 10*3/uL (ref 0.0–0.7)
Eosinophils Relative: 2 % (ref 0–5)
LYMPHS ABS: 1.4 10*3/uL (ref 0.7–4.0)
LYMPHS PCT: 21 % (ref 12–46)
MONOS PCT: 9 % (ref 3–12)
Monocytes Absolute: 0.6 10*3/uL (ref 0.1–1.0)
NEUTROS ABS: 4.4 10*3/uL (ref 1.7–7.7)
NEUTROS PCT: 67 % (ref 43–77)

## 2014-10-03 LAB — CBC
HEMATOCRIT: 34.9 % — AB (ref 39.0–52.0)
Hemoglobin: 12.5 g/dL — ABNORMAL LOW (ref 13.0–17.0)
MCH: 33.2 pg (ref 26.0–34.0)
MCHC: 35.8 g/dL (ref 30.0–36.0)
MCV: 92.6 fL (ref 78.0–100.0)
PLATELETS: 274 10*3/uL (ref 150–400)
RBC: 3.77 MIL/uL — ABNORMAL LOW (ref 4.22–5.81)
RDW: 12.6 % (ref 11.5–15.5)
WBC: 6.5 10*3/uL (ref 4.0–10.5)

## 2014-10-03 LAB — COMPREHENSIVE METABOLIC PANEL
ALT: 14 U/L (ref 0–53)
ANION GAP: 9 (ref 5–15)
AST: 27 U/L (ref 0–37)
Albumin: 4.4 g/dL (ref 3.5–5.2)
Alkaline Phosphatase: 104 U/L (ref 39–117)
BUN: 19 mg/dL (ref 6–23)
CALCIUM: 9.3 mg/dL (ref 8.4–10.5)
CHLORIDE: 108 mmol/L (ref 96–112)
CO2: 24 mmol/L (ref 19–32)
Creatinine, Ser: 1.67 mg/dL — ABNORMAL HIGH (ref 0.50–1.35)
GFR calc Af Amer: 56 mL/min — ABNORMAL LOW (ref >90)
GFR calc non Af Amer: 48 mL/min — ABNORMAL LOW (ref >90)
Glucose, Bld: 205 mg/dL — ABNORMAL HIGH (ref 70–99)
Potassium: 4.1 mmol/L (ref 3.5–5.1)
SODIUM: 141 mmol/L (ref 135–145)
Total Bilirubin: 0.5 mg/dL (ref 0.3–1.2)
Total Protein: 7.2 g/dL (ref 6.0–8.3)

## 2014-10-03 LAB — CBG MONITORING, ED: Glucose-Capillary: 164 mg/dL — ABNORMAL HIGH (ref 70–99)

## 2014-10-03 LAB — I-STAT CHEM 8, ED
BUN: 19 mg/dL (ref 6–23)
Calcium, Ion: 1.27 mmol/L — ABNORMAL HIGH (ref 1.12–1.23)
Chloride: 100 mmol/L (ref 96–112)
Creatinine, Ser: 1.7 mg/dL — ABNORMAL HIGH (ref 0.50–1.35)
GLUCOSE: 210 mg/dL — AB (ref 70–99)
HEMATOCRIT: 38 % — AB (ref 39.0–52.0)
HEMOGLOBIN: 12.9 g/dL — AB (ref 13.0–17.0)
Potassium: 4 mmol/L (ref 3.5–5.1)
SODIUM: 141 mmol/L (ref 135–145)
TCO2: 21 mmol/L (ref 0–100)

## 2014-10-03 LAB — PROTIME-INR
INR: 0.96 (ref 0.00–1.49)
Prothrombin Time: 12.9 seconds (ref 11.6–15.2)

## 2014-10-03 LAB — APTT: aPTT: 34 seconds (ref 24–37)

## 2014-10-03 LAB — I-STAT TROPONIN, ED: Troponin i, poc: 0.02 ng/mL (ref 0.00–0.08)

## 2014-10-03 LAB — ETHANOL: Alcohol, Ethyl (B): <5 mg/dL (ref 0–9)

## 2014-10-03 MED ORDER — ALTEPLASE 100 MG IV SOLR
INTRAVENOUS | Status: AC
  Filled 2014-10-03: qty 1

## 2014-10-03 MED ORDER — ALTEPLASE (STROKE) FULL DOSE INFUSION
0.9000 mg/kg | Freq: Once | INTRAVENOUS | Status: AC
  Administered 2014-10-03: 61 mg via INTRAVENOUS
  Filled 2014-10-03: qty 61

## 2014-10-03 NOTE — ED Notes (Signed)
Dr. Fayrene FearingJames notified of patient and symptoms.
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
## 2014-10-03 NOTE — ED Notes (Signed)
Patient reports headache earlier today. Denies any pain at this time.

## 2014-10-03 NOTE — Progress Notes (Signed)
Beeper went off at    2153  This went off while pt. In room Pt. In room    2151 Pt. Scanned     [facility]   [facility]   Pt. Out of room at    2155 Rad Called at    2157

## 2014-10-03 NOTE — ED Notes (Signed)
Patient reports he was watching TV and suddenly started having numbness and weaknes on right side of body from shoulder down. Complaining of weakness and numbness to right arm and right leg. Patient also states is having trouble getting his words out.

## 2014-10-04 ENCOUNTER — Encounter (HOSPITAL_COMMUNITY): Payer: Self-pay | Admitting: *Deleted

## 2014-10-04 ENCOUNTER — Inpatient Hospital Stay (HOSPITAL_COMMUNITY): Payer: Medicare Other

## 2014-10-04 DIAGNOSIS — M6289 Other specified disorders of muscle: Secondary | ICD-10-CM

## 2014-10-04 DIAGNOSIS — E1021 Type 1 diabetes mellitus with diabetic nephropathy: Secondary | ICD-10-CM

## 2014-10-04 DIAGNOSIS — I10 Essential (primary) hypertension: Secondary | ICD-10-CM

## 2014-10-04 DIAGNOSIS — I6350 Cerebral infarction due to unspecified occlusion or stenosis of unspecified cerebral artery: Secondary | ICD-10-CM

## 2014-10-04 DIAGNOSIS — E785 Hyperlipidemia, unspecified: Secondary | ICD-10-CM

## 2014-10-04 DIAGNOSIS — I639 Cerebral infarction, unspecified: Principal | ICD-10-CM

## 2014-10-04 LAB — LIPID PANEL
CHOLESTEROL: 159 mg/dL (ref 0–200)
HDL: 43 mg/dL (ref >39)
LDL Cholesterol: 96 mg/dL (ref 0–99)
Total CHOL/HDL Ratio: 3.7 RATIO
Triglycerides: 102 mg/dL (ref <150)
VLDL: 20 mg/dL (ref 0–40)

## 2014-10-04 LAB — URINE MICROSCOPIC-ADD ON

## 2014-10-04 LAB — URINALYSIS, ROUTINE W REFLEX MICROSCOPIC
Bilirubin Urine: NEGATIVE
Glucose, UA: NEGATIVE mg/dL
Hgb urine dipstick: NEGATIVE
Ketones, ur: NEGATIVE mg/dL
Leukocytes, UA: NEGATIVE
Nitrite: NEGATIVE
PH: 6 (ref 5.0–8.0)
PROTEIN: 100 mg/dL — AB
Specific Gravity, Urine: 1.013 (ref 1.005–1.030)
Urobilinogen, UA: 1 mg/dL (ref 0.0–1.0)

## 2014-10-04 LAB — RAPID URINE DRUG SCREEN, HOSP PERFORMED
Amphetamines: NOT DETECTED
BARBITURATES: NOT DETECTED
Benzodiazepines: POSITIVE — AB
Cocaine: NOT DETECTED
Opiates: NOT DETECTED
Tetrahydrocannabinol: POSITIVE — AB

## 2014-10-04 LAB — MRSA PCR SCREENING: MRSA by PCR: NEGATIVE

## 2014-10-04 LAB — GLUCOSE, CAPILLARY
Glucose-Capillary: 128 mg/dL — ABNORMAL HIGH (ref 70–99)
Glucose-Capillary: 108 mg/dL — ABNORMAL HIGH (ref 70–99)
Glucose-Capillary: 100 mg/dL — ABNORMAL HIGH (ref 70–99)
Glucose-Capillary: 79 mg/dL (ref 70–99)
Glucose-Capillary: 106 mg/dL — ABNORMAL HIGH (ref 70–99)

## 2014-10-04 MED ORDER — SODIUM CHLORIDE 0.9 % IV SOLN
INTRAVENOUS | Status: DC
  Administered 2014-10-04: 75 mL/h via INTRAVENOUS
  Administered 2014-10-04 – 2014-10-05 (×2): via INTRAVENOUS

## 2014-10-04 MED ORDER — LORAZEPAM BOLUS VIA INFUSION: 1.0000 mg | INTRAVENOUS | Status: DC | PRN

## 2014-10-04 MED ORDER — LABETALOL HCL 5 MG/ML IV SOLN: 10.0000 mg | INTRAVENOUS | Status: DC | PRN

## 2014-10-04 MED ORDER — LORAZEPAM 2 MG/ML IJ SOLN
1.0000 mg | Freq: Once | INTRAMUSCULAR | Status: AC
  Administered 2014-10-04: 1 mg via INTRAVENOUS

## 2014-10-04 MED ORDER — HYDRALAZINE HCL 25 MG PO TABS
25.0000 mg | ORAL_TABLET | Freq: Three times a day (TID) | ORAL | Status: DC
  Administered 2014-10-04 – 2014-10-05 (×5): 25 mg via ORAL
  Filled 2014-10-04 (×7): qty 1

## 2014-10-04 MED ORDER — PANTOPRAZOLE SODIUM 40 MG IV SOLR
40.0000 mg | Freq: Every day | INTRAVENOUS | Status: DC
  Administered 2014-10-04 (×2): 40 mg via INTRAVENOUS
  Filled 2014-10-04 (×3): qty 40

## 2014-10-04 MED ORDER — METOPROLOL TARTRATE 50 MG PO TABS
50.0000 mg | ORAL_TABLET | Freq: Two times a day (BID) | ORAL | Status: DC
  Administered 2014-10-04 – 2014-10-05 (×3): 50 mg via ORAL
  Filled 2014-10-04 (×4): qty 1

## 2014-10-04 MED ORDER — INSULIN ASPART 100 UNIT/ML ~~LOC~~ SOLN: 0.0000 [IU] | SUBCUTANEOUS | Status: DC

## 2014-10-04 MED ORDER — LORAZEPAM 2 MG/ML IJ SOLN
INTRAMUSCULAR | Status: AC
Start: 2014-10-04 — End: 2014-10-04
  Filled 2014-10-04: qty 1

## 2014-10-04 MED ORDER — SIMVASTATIN 20 MG PO TABS
20.0000 mg | ORAL_TABLET | Freq: Every day | ORAL | Status: DC
  Filled 2014-10-04 (×2): qty 1

## 2014-10-04 MED ORDER — LORAZEPAM 2 MG/ML IJ SOLN
1.0000 mg | Freq: Once | INTRAMUSCULAR | Status: AC | PRN
  Administered 2014-10-04: 1 mg via INTRAVENOUS
  Filled 2014-10-04: qty 1

## 2014-10-04 MED ORDER — STROKE: EARLY STAGES OF RECOVERY BOOK
Freq: Once | Status: AC
  Administered 2014-10-04: 03:00:00
  Filled 2014-10-04: qty 1

## 2014-10-04 MED ORDER — AMLODIPINE BESYLATE 10 MG PO TABS
10.0000 mg | ORAL_TABLET | Freq: Every day | ORAL | Status: DC
  Administered 2014-10-04 – 2014-10-05 (×2): 10 mg via ORAL
  Filled 2014-10-04 (×2): qty 1

## 2014-10-04 NOTE — Progress Notes (Signed)
eLink Physician-Brief Progress Note Patient Name: Aaron HillClyde J Veras DOB: 12-Jul-1970 MRN: [id number]   Date of Service  10/04/2014  HPI/Events of Note  [age] M with PMH of DM, CKD, CAD s/p stents presenting to [facility] with c/o of slurred speech and right-sided weakness of arm and leg.  CT was negative but per code stroke received tPA.  [facility]/care.  Patient is alert, oriented and HD stable.  eICU Interventions  Plan of care per Neurology stroke team Have added SSI q4 hours sensitive scale [facility]     Intervention Category Evaluation Type: New Patient Evaluation  Lylia Karn 10/04/2014, 1:14 AM

## 2014-10-04 NOTE — Progress Notes (Signed)
  Echocardiogram 2D Echocardiogram has been performed.  Janalyn HarderWest, Reinhart Saulters R 10/04/2014, 12:50 PM

## 2014-10-04 NOTE — Progress Notes (Signed)
eLink Physician-Brief Progress Note Patient Name: Aaron Curry DOB: [birth year] MRN: [id number]   Date of Service  10/04/2014  HPI/Events of Note  Per nurse patient is on an insulin pump that monitors blood sugar q1 hour  eICU Interventions  q4 hour SSI d/ced Recommended that nursing contact diabetic nurse clinician in AM regarding pump        DETERDING,ELIZABETH 10/04/2014, 1:35 AM

## 2014-10-04 NOTE — ED Notes (Signed)
Dr. Fayrene FearingJames in assessing patient
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
## 2014-10-04 NOTE — Progress Notes (Signed)
STROKE TEAM PROGRESS NOTE   HISTORY Aaron Curry is a 44 y.o. male history of hypertension, coronary artery disease with stent placement, type 2 diabetes mellitus and chronic kidney disease and PTSD, presenting with new onset speech output difficulty as well as weakness and numbness involving right arm and right leg. Onset was at 7:30 PM on 10/02/2013. He has no previous history of stroke nor TIA. CT scan of his head showed no acute intracranial abnormality. He was deemed a candidate for intravenous thrombolytic therapy with TPA, which was administered in [facility] ED at [facility] prior to transfer [facility]. He has experienced an improvement in strength as well as surgery changes on the right. Speech is returned to normal. NIH stroke score was 1 for residual sensory changes on the right.  LSN: 7:30 PM on 10/03/2014 tPA Given: Yes mRankin:     SUBJECTIVE (INTERVAL HISTORY) No family members present. The patient feels much improved but still with mild right hemisensory deficits and right lower extremity weakness. The patient reports his mother died from a stroke.   OBJECTIVE Temp:  [98.1 F (36.7 C)-98.7 F (37.1 C)] 98.3 F (36.8 C) (04/17 0727) Pulse Rate:  [63-93] 63 (04/17 0700) Cardiac Rhythm:  [-] Normal sinus rhythm (04/17 0056) Resp:  [8-25] 16 (04/17 0700) BP: (113-165)/(58-99) 130/72 mmHg (04/17 0700) SpO2:  [94 %-100 %] 96 % (04/17 0700) Weight:  [67.8 kg (149 lb 7.6 oz)-68.04 kg (150 lb)] 67.8 kg (149 lb 7.6 oz) (04/17 0030)   Recent Labs Lab 10/03/14 2142 10/04/14 0141 10/04/14 0348 10/04/14 0658  GLUCAP 164* 128* 108* 100*    Recent Labs Lab 10/03/14 2206 10/03/14 2220  NA 141 141  K 4.0 4.1  CL 100 108  CO2  --  24  GLUCOSE 210* 205*  BUN 19 19  CREATININE 1.70* 1.67*  CALCIUM  --  9.3    Recent Labs Lab 10/03/14 2220  AST 27  ALT 14  ALKPHOS 104  BILITOT 0.5  PROT 7.2  ALBUMIN 4.4    Recent Labs Lab 10/03/14 2206 10/03/14 2220  WBC   --  6.5  NEUTROABS  --  4.4  HGB 12.9* 12.5*  HCT 38.0* 34.9*  MCV  --  92.6  PLT  --  274   No results for input(s): CKTOTAL, CKMB, CKMBINDEX, TROPONINI in the last 168 hours.  Recent Labs  10/03/14 2220  LABPROT 12.9  INR 0.96    Recent Labs  10/04/14 0630  COLORURINE YELLOW  LABSPEC 1.013  PHURINE 6.0  GLUCOSEU NEGATIVE  HGBUR NEGATIVE  BILIRUBINUR NEGATIVE  KETONESUR NEGATIVE  PROTEINUR 100*  UROBILINOGEN 1.0  NITRITE NEGATIVE  LEUKOCYTESUR NEGATIVE       Component Value Date/Time   CHOL 159 10/04/2014 0225   TRIG 102 10/04/2014 0225   HDL 43 10/04/2014 0225   CHOLHDL 3.7 10/04/2014 0225   VLDL 20 10/04/2014 0225   LDLCALC 96 10/04/2014 0225   Lab Results  Component Value Date   HGBA1C 8.3* 03/21/2014      Component Value Date/Time   LABOPIA NONE DETECTED 10/04/2014 0630   COCAINSCRNUR NONE DETECTED 10/04/2014 0630   LABBENZ POSITIVE* 10/04/2014 0630   AMPHETMU NONE DETECTED 10/04/2014 0630   THCU POSITIVE* 10/04/2014 0630   LABBARB NONE DETECTED 10/04/2014 0630     Recent Labs Lab 10/03/14 2220  ETH <5    Ct Head Wo Contrast 10/03/2014    Negative head CT.     MRI / MRA Brain -  Pending    PHYSICAL EXAM Pleasant middle-age Caucasian male currently not in distress. . Afebrile. Head is nontraumatic. Neck is supple without bruit.    Cardiac exam no murmur or gallop. Lungs are clear to auscultation. Distal pulses are well felt. Neurological Exam :  Awake alert oriented x 3 normal speech and language. Mild right lower face asymmetry. Tongue midline. Mild right lower extremity drift. Mild diminished fine finger movements on right. Orbits left over right upper extremity. Mild  right grip and right hip flexor weakness.. Decrease right hemibody sensation . Normal coordination. Gait deferred  NIHSS 2. Modified Rankin 2  ASSESSMENT/PLAN Mr. Aaron Curry is a 44 y.o. male with history of hypertension, coronary artery disease with stent placement,  type 2 diabetes mellitus and chronic kidney disease and PTSD, presenting with speech output difficulty, weakness and numbness involving right arm and right leg . He did receive IV t-PA 10/03/2014 at 2245.  Stroke:  Dominant   Resultant - residual right lower extremity weakness and right hemisensory deficits.  MRI  pending  MRA  pending  Carotid Doppler  pending  2D Echo  pending  LDL 96  HgbA1c pending  SCDs for VTE prophylaxis  Diet Heart Room service appropriate?: Yes; Fluid consistency:: Thin  aspirin 81 mg orally every day prior to admission, now on no antithrombotic secondary to TPA therapy.  Ongoing aggressive stroke risk factor management  Therapy recommendations: Pending  Disposition:  Pending  Hypertension  Home meds: Norvasc, Apresoline, metoprolol, and lisinopril,  Stable   Hyperlipidemia  Home meds:  Zocor 10 mg each evening not resumed in hospital  LDL 96, goal < 70  Increase Zocor to 20 mg each evening  Continue statin at discharge  Diabetes  HgbA1c pending, goal < 7.0  Uncontrolled  Other Stroke Risk Factors  Cigarette smoker, quit smoking   ETOH use  Coronary artery disease   Other Active Problems  Creatinine 1.67  UDS positive for THCU and benzodiazepines  Other Pertinent History  TPA therapy  Insulin pump   PLAN  Resume antihypertensive medications except for Lotensin at this time.  Recheck renal function and CBC tomorrow. Probable CT angiogram.  Await further imaging and resume antiplatelet therapy if appropriate.    Hospital day # 1  Delton See PA-C [facility] Pager [phone] 10/04/2014, 8:52 AM I have personally examined this patient, reviewed notes, independently viewed imaging studies, participated in medical decision making and plan of care. I have made any additions or clarifications directly to the above note. Agree with note above.  Presented with sudden onset of right  hemiparesis, numbness and speech difficulties likely due to left brain subcortical infarct. He has received IV tPA with good recovery. He remains at risk for neurological worsening, recurrent stroke/TIA needs ongoing stroke evaluation and aggressive risk factor modification. Check repeat brain imaging this evening and aspirin if no hemorrhage This patient is critically ill and at significant risk of neurological worsening, death and care requires constant monitoring of vital signs, hemodynamics,respiratory and cardiac monitoring,review of multiple databases, neurological assessment, discussion with family, other specialists and medical decision making of high complexity.I have made any additions or clarifications directly to the above note.  I spent 30 minutes of neurocritical care time  in the care of  this patient.   Delia Heady, MD Medical Director [facility] Pager: [phone] 10/04/2014 11:28 AM     To contact Stroke Continuity provider, please refer to [email or web address]. After hours, contact General Neurology

## 2014-10-05 LAB — BASIC METABOLIC PANEL
ANION GAP: 11 (ref 5–15)
BUN: 18 mg/dL (ref 6–23)
CO2: 22 mmol/L (ref 19–32)
Calcium: 8.5 mg/dL (ref 8.4–10.5)
Chloride: 106 mmol/L (ref 96–112)
Creatinine, Ser: 1.63 mg/dL — ABNORMAL HIGH (ref 0.50–1.35)
GFR calc Af Amer: 58 mL/min — ABNORMAL LOW (ref >90)
GFR calc non Af Amer: 50 mL/min — ABNORMAL LOW (ref >90)
Glucose, Bld: 164 mg/dL — ABNORMAL HIGH (ref 70–99)
Potassium: 3.1 mmol/L — ABNORMAL LOW (ref 3.5–5.1)
SODIUM: 139 mmol/L (ref 135–145)

## 2014-10-05 LAB — CBC
HEMATOCRIT: 32.8 % — AB (ref 39.0–52.0)
Hemoglobin: 11.6 g/dL — ABNORMAL LOW (ref 13.0–17.0)
MCH: 32.5 pg (ref 26.0–34.0)
MCHC: 35.4 g/dL (ref 30.0–36.0)
MCV: 91.9 fL (ref 78.0–100.0)
PLATELETS: 158 10*3/uL (ref 150–400)
RBC: 3.57 MIL/uL — ABNORMAL LOW (ref 4.22–5.81)
RDW: 12.6 % (ref 11.5–15.5)
WBC: 6 10*3/uL (ref 4.0–10.5)

## 2014-10-05 LAB — GLUCOSE, CAPILLARY
GLUCOSE-CAPILLARY: 147 mg/dL — AB (ref 70–99)
Glucose-Capillary: 203 mg/dL — ABNORMAL HIGH (ref 70–99)
Glucose-Capillary: 146 mg/dL — ABNORMAL HIGH (ref 70–99)
Glucose-Capillary: 233 mg/dL — ABNORMAL HIGH (ref 70–99)
Glucose-Capillary: 127 mg/dL — ABNORMAL HIGH (ref 70–99)
Glucose-Capillary: 217 mg/dL — ABNORMAL HIGH (ref 70–99)

## 2014-10-05 LAB — HEMOGLOBIN A1C
HEMOGLOBIN A1C: 7 % — AB (ref 4.8–5.6)
MEAN PLASMA GLUCOSE: 154 mg/dL

## 2014-10-05 MED ORDER — SIMVASTATIN 20 MG PO TABS: 20.0000 mg | ORAL_TABLET | Freq: Every evening | ORAL | Status: AC

## 2014-10-05 MED ORDER — ATORVASTATIN CALCIUM 20 MG PO TABS
20.0000 mg | ORAL_TABLET | Freq: Every day | ORAL | Status: DC
  Filled 2014-10-05: qty 1

## 2014-10-05 MED ORDER — CLOPIDOGREL BISULFATE 75 MG PO TABS: 75.0000 mg | ORAL_TABLET | Freq: Every day | ORAL | Status: AC

## 2014-10-05 MED ORDER — CLOPIDOGREL BISULFATE 75 MG PO TABS
75.0000 mg | ORAL_TABLET | Freq: Every day | ORAL | Status: DC
  Administered 2014-10-05: 75 mg via ORAL
  Filled 2014-10-05: qty 1

## 2014-10-05 MED ORDER — ASPIRIN 325 MG PO TABS
325.0000 mg | ORAL_TABLET | Freq: Every day | ORAL | Status: DC
  Administered 2014-10-05: 325 mg via ORAL
  Filled 2014-10-05: qty 1

## 2014-10-05 MED ORDER — PANTOPRAZOLE SODIUM 40 MG PO TBEC
40.0000 mg | DELAYED_RELEASE_TABLET | Freq: Every day | ORAL | Status: DC
  Administered 2014-10-05: 40 mg via ORAL
  Filled 2014-10-05: qty 1

## 2014-10-05 MED ORDER — SIMVASTATIN 40 MG PO TABS
40.0000 mg | ORAL_TABLET | Freq: Every day | ORAL | Status: DC
  Filled 2014-10-05: qty 1

## 2014-10-05 NOTE — Progress Notes (Signed)
Bilateral carotid artery duplex completed:  1-39% ICA stenosis.  Vertebral artery flow is antegrade.     

## 2014-10-05 NOTE — Progress Notes (Signed)
STROKE TEAM PROGRESS NOTE   HISTORY Aaron HillClyde J Vester is a 44 y.o. male history of hypertension, coronary artery disease with stent placement, type 2 diabetes mellitus and chronic kidney disease and PTSD, presenting with new onset speech output difficulty as well as weakness and numbness involving right arm and right leg. Onset was at 7:30 PM on 10/02/2013. He has no previous history of stroke nor TIA. CT scan of his head showed no acute intracranial abnormality. He was deemed a candidate for intravenous thrombolytic therapy with TPA, which was administered in [facility] ED at [facility] prior [facility]. He has experienced an improvement in strength as well as surgery changes on the right. Speech is returned to normal. NIH stroke score was 1 for residual sensory changes on the right.  LSN: 7:30 PM on 10/03/2014 tPA Given: Yes mRankin:     SUBJECTIVE (INTERVAL HISTORY) No family members present. The patient feels much improved but with resolved right hemisensory deficits and  No lower extremity weakness. He wants to go home.CUS no stenosis this am  OBJECTIVE Temp:  [97.9 F (36.6 C)-98.6 F (37 C)] 97.9 F (36.6 C) (04/18 0400) Pulse Rate:  [57-78] 64 (04/18 0729) Cardiac Rhythm:  [-] Normal sinus rhythm (04/18 0800) Resp:  [12-22] 14 (04/18 0729) BP: (104-163)/(57-104) 146/81 mmHg (04/18 0729) SpO2:  [94 %-99 %] 96 % (04/18 0700) Weight:  [149 lb 4 oz (67.7 kg)] 149 lb 4 oz (67.7 kg) (04/18 0400)   Recent Labs Lab 10/04/14 1200 10/04/14 1628 10/04/14 2026 10/05/14 0009 10/05/14 0404  GLUCAP 106* 147* 233* 203* 146*    Recent Labs Lab 10/03/14 2206 10/03/14 2220 10/05/14 0218  NA 141 141 139  K 4.0 4.1 3.1*  CL 100 108 106  CO2  --  24 22  GLUCOSE 210* 205* 164*  BUN 19 19 18   CREATININE 1.70* 1.67* 1.63*  CALCIUM  --  9.3 8.5    Recent Labs Lab 10/03/14 2220  AST 27  ALT 14  ALKPHOS 104  BILITOT 0.5  PROT 7.2  ALBUMIN 4.4    Recent Labs Lab  10/03/14 2206 10/03/14 2220 10/05/14 0218  WBC  --  6.5 6.0  NEUTROABS  --  4.4  --   HGB 12.9* 12.5* 11.6*  HCT 38.0* 34.9* 32.8*  MCV  --  92.6 91.9  PLT  --  274 158   No results for input(s): CKTOTAL, CKMB, CKMBINDEX, TROPONINI in the last 168 hours.  Recent Labs  10/03/14 2220  LABPROT 12.9  INR 0.96    Recent Labs  10/04/14 0630  COLORURINE YELLOW  LABSPEC 1.013  PHURINE 6.0  GLUCOSEU NEGATIVE  HGBUR NEGATIVE  BILIRUBINUR NEGATIVE  KETONESUR NEGATIVE  PROTEINUR 100*  UROBILINOGEN 1.0  NITRITE NEGATIVE  LEUKOCYTESUR NEGATIVE       Component Value Date/Time   CHOL 159 10/04/2014 0225   TRIG 102 10/04/2014 0225   HDL 43 10/04/2014 0225   CHOLHDL 3.7 10/04/2014 0225   VLDL 20 10/04/2014 0225   LDLCALC 96 10/04/2014 0225   Lab Results  Component Value Date   HGBA1C 7.0* 10/04/2014      Component Value Date/Time   LABOPIA NONE DETECTED 10/04/2014 0630   COCAINSCRNUR NONE DETECTED 10/04/2014 0630   LABBENZ POSITIVE* 10/04/2014 0630   AMPHETMU NONE DETECTED 10/04/2014 0630   THCU POSITIVE* 10/04/2014 0630   LABBARB NONE DETECTED 10/04/2014 0630     Recent Labs Lab 10/03/14 2220  ETH <5  Ct Head Wo Contrast 10/03/2014    Negative head CT.     MRI / MRA Brain -1. 5 mm acute ischemic infarct within the left thalamus. No associated hemorrhage or significant mass effect. 2. No other acute intracranial process identified. 3. Mild chronic small vessel ischemic disease. Normal MRA of the intracranial circulation with no arterial occlusion or hemodynamically significant stenosis.   PHYSICAL EXAM Pleasant middle-age Caucasian male currently not in distress. . Afebrile. Head is nontraumatic. Neck is supple without bruit.    Cardiac exam no murmur or gallop. Lungs are clear to auscultation. Distal pulses are well felt. Neurological Exam :  Awake alert oriented x 3 normal speech and language. Mild right lower face asymmetry. Tongue midline. No right  lower extremity drift. Mild diminished fine finger movements on right. Orbits left over right upper extremity. No right grip and right hip flexor weakness.. Normal right hemibody sensation . Normal coordination. Gait deferred  NIHSS 1. Modified Rankin 1  ASSESSMENT/PLAN Mr. CAMDON SAETERN is a 44 y.o. male with history of hypertension, coronary artery disease with stent placement, type 2 diabetes mellitus and chronic kidney disease and PTSD, presenting with speech output difficulty, weakness and numbness involving right arm and right leg . He did receive IV t-PA 10/03/2014 at 2245.  Stroke:  Dominant   Resultant - residual   right hemisensory deficits.  MRI   Left thalamic infarct  MRA  No large vessel stenosis  Carotid Doppler  No extracranial stenosis  2D Echo  Left ventricle: The cavity size was normal. Wall thickness was increased in a pattern of moderate LVH. Systolic function was normal. The estimated ejection fraction was in the range of 50% to 55%. There is akinesis of the mid-apicalanteroseptal and apical myocardium.  LDL 96  HgbA1c 7.0  SCDs for VTE prophylaxis Diet Heart Room service appropriate?: Yes; Fluid consistency:: Thin  aspirin 81 mg orally every day prior to admission, now on no antithrombotic secondary to TPA therapy.  Ongoing aggressive stroke risk factor management  Therapy recommendations:home  Disposition:  home  Hypertension  Home meds: Norvasc, Apresoline, metoprolol, and lisinopril,  Stable   Hyperlipidemia  Home meds:  Zocor 10 mg each evening not resumed in hospital  LDL 96, goal < 70  Increase Zocor to 20 mg each evening  Continue statin at discharge  Diabetes  HgbA1c 7 goal < 7.0  Uncontrolled  Other Stroke Risk Factors  Cigarette smoker, quit smoking   ETOH use  Coronary artery disease   Other Active Problems  Creatinine 1.67  UDS positive for THCU and benzodiazepines-Patient counselled to quit  marijuana  Other Pertinent History  TPA therapy  Insulin pump   PLAN DC home today on aspirin and zocor and continue aggressive diabetes control and quit marijuana Follow up as outpatient in stroke clinic in 2 months   Delia Heady, MD Medical Director [facility] Pager: [phone] 10/05/2014, 1:02 PM          To contact Stroke Continuity provider, please refer to [email or web address]. After hours, contact General Neurology

## 2014-10-05 NOTE — Progress Notes (Signed)
OT Cancellation Note  Patient Details Name: Aaron Curry MRN: [id number] DOB: June 21, 1970   Cancelled Treatment:    Reason Eval/Treat Not Completed: OT screened, no needs identified, will sign off  [facility] Clinic[facility],HILLARY  Yannis Broce, OTR/L  [phone] 10/05/2014 10/05/2014, 1:30 PM

## 2014-10-05 NOTE — Evaluation (Signed)
Physical Therapy Evaluation Patient Details Name: Aaron Curry MRN: [id number] DOB: 02/02/1971 Today's Date: 10/05/2014   History of Present Illness  pt presents with L Thalamus infarct, recieved tpa, and now only has mild sensory deficits in R foot.  pt with PMH of DM, CAD, and PTSD.  Clinical Impression  Pt moving well and without deficit.  Pt has no further therapy needs at this time, will sign off.      Follow Up Recommendations No PT follow up    Equipment Recommendations  None recommended by PT    Recommendations for Other Services       Precautions / Restrictions Precautions Precautions: None Restrictions Weight Bearing Restrictions: No      Mobility  Bed Mobility Overal bed mobility: Independent                Transfers Overall transfer level: Independent Equipment used: None                Ambulation/Gait Ambulation/Gait assistance: Independent Ambulation Distance (Feet): 200 Feet Assistive device: None Gait Pattern/deviations: WFL(Within Functional Limits)   Gait velocity interpretation: at or above normal speed for age/gender General Gait Details: pt able to negotiate over and around obstacles without deficit.  pt able to stop quickly and change direction without deficit.    Stairs            Wheelchair Mobility    Modified Rankin (Stroke Patients Only) Modified Rankin (Stroke Patients Only) Pre-Morbid Rankin Score: No symptoms Modified Rankin: No significant disability     Balance Overall balance assessment: Independent                                           Pertinent Vitals/Pain Pain Assessment: No/denies pain    Home Living Family/patient expects to be discharged to:: Private residence Living Arrangements: Alone Available Help at Discharge: Family;Friend(s);Available PRN/intermittently Type of Home: House Home Access: Stairs to enter Entrance Stairs-Rails: Right Entrance Stairs-Number of  Steps: 3 Home Layout: One level Home Equipment: None      Prior Function Level of Independence: Independent               Hand Dominance        Extremity/Trunk Assessment   Upper Extremity Assessment: Overall WFL for tasks assessed           Lower Extremity Assessment: RLE deficits/detail RLE Deficits / Details: Only deficit is diminished sensation on bottom of R foot.      Cervical / Trunk Assessment: Normal  Communication   Communication: No difficulties  Cognition Arousal/Alertness: Awake/alert Behavior During Therapy: WFL for tasks assessed/performed Overall Cognitive Status: Within Functional Limits for tasks assessed                      General Comments      Exercises        Assessment/Plan    PT Assessment Patent does not need any further PT services  PT Diagnosis Difficulty walking   PT Problem List    PT Treatment Interventions     PT Goals (Current goals can be found in the Care Plan section) Acute Rehab PT Goals PT Goal Formulation: All assessment and education complete, DC therapy    Frequency     Barriers to discharge        Co-evaluation  End of Session   Activity Tolerance: Patient tolerated treatment well Patient left: in bed;with call bell/phone within reach Nurse Communication: Mobility status         Time: 1000-1011 PT Time Calculation (min) (ACUTE ONLY): 11 min   Charges:   PT Evaluation $Initial PT Evaluation Tier I: 1 Procedure     PT G CodesSunny Curry, Aaron Curry [phone] 10/05/2014, 10:40 AM

## 2014-10-05 NOTE — Progress Notes (Signed)
Speech Language Pathology  Patient Details Name: Aaron Curry MRN: [id number] DOB: [birth year] Today's Date: 10/05/2014 Time:  -       SLE will be completed next date.   [name] M.Ed CCC-SLP Pager [phone]      [name], [name] 10/05/2014, 3:22 PM

## 2014-10-05 NOTE — Discharge Summary (Signed)
Physician Discharge Summary  Patient ID: Aaron Curry MRN: [id number] DOB/AGE: 10-07-1970 44 y.o.  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 male history of hypertension, coronary artery disease with stent placement, type 2 diabetes mellitus and chronic kidney disease and PTSD, presenting with new onset speech output difficulty as well as weakness and numbness involving right arm and right leg. Onset was at 7:30 PM on 10/02/2013. He has no previous history of stroke nor TIA. CT scan of his head showed no acute intracranial abnormality. He was deemed a candidate for intravenous thrombolytic therapy with TPA, which [facility] after consultation with Specialist on call prior to [facility] to [facility]. He has experienced an improvement in strength as well as surgery changes on the right. Speech  returned to normal. NIH stroke score was 1 for residual sensory changes on the right.  LSN: 7:30 PM on 10/03/2014 tPA Given: Yes Patient was [facility]. Blood pressure was tightly controlled as per post TPA protocol and he had close neurological monitoring. He remained stable and showed progressive improvement in his right-sided mild weakness as well as sensory disturbance. CT scan then showed no hemorrhage. MRI  scan of the brain showed a 5 mm left thalamic acute infarct. MRA of the brain showed moderate stenosis of the left posterior cerebral artery and carotid ultrasound showed no significant extracranial stenosis. Transthoracic echo showed normal ejection fraction and there was mild occurrences of the Vicodin myocardial segment. Hemoglobin A1c was borderline at 7.0. LDL cholesterol is mildly elevated at 96 mg percent. Patient had previously been on aspirin this was switched to Plavix and the dose of Zocor was increased from 10 to 20 mg daily. He counseled to quit marijuana as well as maintain strict control of diabetes with hemoglobin A1c goal below 6.5. His blood pressure medications were initially partially helped and then resume at the time of discharge. He was seen by physical and occupational therapy and felt to have her own needs for an outpatient therapy. At the time of discharge he had only mild numbness in his right foot but his hemibody numbness as well as weakness had improved. Consults: none Significant Diagnostic Studies:   Ct Head Wo Contrast 10/03/2014  Negative head CT.   MRI / MRA Brain -1. 5 mm acute ischemic infarct within the left thalamus. No associated hemorrhage or significant mass effect. 2. No other acute intracranial process identified. 3. Mild chronic small vessel ischemic disease. Normal MRA of the intracranial circulation with no arterial occlusion or hemodynamically significant stenosis.  Carotid Doppler No extracranial stenosis  2D Echo Left ventricle: The cavity size was normal. Wall thickness was increased in a pattern of moderate LVH. Systolic function was normal. The estimated ejection fraction was in the range of 50% to 55%. There is akinesis of the mid-apicalanteroseptal and apical myocardium.  LDL 96  HgbA1c 7.0 Treatments:  Iv TPA Discharge Exam: Blood pressure 146/81, pulse 64, temperature 97.9 F (36.6 C), temperature source Oral, resp.  rate 14, height  (1.676 m), weight 149 lb 4 oz (67.7 kg), SpO2 96 %.  PHYSICAL EXAM Pleasant middle-age Caucasian male currently not in distress. . Afebrile. Head is nontraumatic. Neck is supple without bruit. Cardiac exam no murmur or gallop. Lungs are clear to auscultation. Distal pulses are well felt. Neurological Exam :  Awake alert oriented x 3 normal speech and language. Mild right lower face asymmetry. Tongue midline. No right lower extremity drift. Mild diminished fine finger movements on right. Orbits left over right upper extremity. No right grip and right hip flexor weakness.. Normal right hemibody sensation . Normal coordination. Gait deferred  NIHSS 1. Modified Rankin 1  Disposition: 01-Home or Self Care  Discharge Instructions    Ambulatory referral to Neurology    Complete by:  As directed   Please schedule post stroke follow up in 2 months.     Driving Restrictions    Complete by:  As directed   We recommend no driving x 1 week            Medication List    STOP taking these medications        aspirin EC 81 MG tablet     isosorbide mononitrate 30 MG 24 hr tablet  Commonly known as:  IMDUR     metoCLOPramide 5 MG tablet  Commonly known as:  REGLAN      TAKE these medications        ALPRAZolam 0.25 MG tablet  Commonly known as:  XANAX  Take 0.25 mg by mouth 2 (two) times daily as needed for anxiety.     amLODipine 10 MG tablet  Commonly known as:  NORVASC  Take 10 mg by mouth daily.     cholecalciferol 1000 UNITS tablet  Commonly known as:  VITAMIN D  Take 1,000 Units by mouth daily.     clopidogrel 75 MG tablet  Commonly known as:  PLAVIX  Take 1 tablet (75 mg total) by mouth daily.     fish oil-omega-3 fatty acids 1000 MG capsule  Take 2 g by mouth 2 (two) times daily.     hydrALAZINE 25 MG tablet  Commonly known as:  APRESOLINE  Take 1 tablet (25 mg total) by mouth every 8 (eight) hours.     insulin pump Soln  Inject 1 each into  the skin continuous.     lisinopril 10 MG tablet  Commonly known as:  PRINIVIL,ZESTRIL  Take 1 tablet (10 mg total) by mouth daily.     metoprolol 50 MG tablet  Commonly known as:  LOPRESSOR  Take 1 tablet (50 mg total) by mouth 2 (two) times daily.     nitroGLYCERIN 0.4 MG SL tablet  Commonly known as:  NITROSTAT  Place 1 tablet (0.4 mg total) under the tongue every 5 (five) minutes as needed for chest pain (severe chest pain or pressure).     pantoprazole 40 MG tablet  Commonly known as:  PROTONIX  Take 1 tablet (40 mg total) by mouth daily.     PARoxetine 40 MG tablet  Commonly known as:  PAXIL  Take 60 mg by mouth every morning.     polyvinyl alcohol 1.4 % ophthalmic solution  Commonly known as:  LIQUIFILM TEARS  Place 1 drop into both eyes as needed for dry eyes.     simvastatin 20 MG tablet  Commonly  known as:  ZOCOR  Take 1 tablet (20 mg total) by mouth every evening.     traZODone 100 MG tablet  Commonly known as:  DESYREL  Take 100 mg by mouth at bedtime.     zolpidem 10 MG tablet  Commonly known as:  AMBIEN  Take 10 mg by mouth at bedtime.           Follow-up Information    Follow up with SETHI,PRAMOD, MD In 2 months.   Specialties:  Neurology, Radiology   Why:  Stroke Clinic, Office will call you with appointment date & time   Contact information:   [address] [place] [address] [phone]       Follow up with primary care MD. Schedule an appointment as soon as possible for a visit in 2 weeks.   Why:  for hospital follow up, get a primary MD if you do not have one     Total time spent for this discharge summary 35 minutes Signed: SETHI,PRAMOD 10/05/2014, 1:10 PM

## 2014-10-05 NOTE — Progress Notes (Signed)
UR completed.  Sunya Humbarger, RN BSN [facility] CCM Trauma/Neuro ICU Case Manager [phone]  

## 2014-10-05 NOTE — Progress Notes (Signed)
OT Cancellation Note  Patient Details Name: Aaron Curry MRN: [id number] DOB: 11-08-1970   Cancelled Treatment:    Reason Eval/Treat Not Completed: Patient not medically ready Pt with current bedrest orders. Please update activity orders when pt is appropriate to begin OT/PT. Thanks [facility]  Rashada Klontz, OTR/L  [phone] 10/05/2014 10/05/2014, 9:04 AM

## 2014-11-10 ENCOUNTER — Encounter: Payer: Self-pay | Admitting: Occupational Therapy

## 2014-11-10 ENCOUNTER — Ambulatory Visit: Payer: Medicare Other | Attending: General Practice | Admitting: Occupational Therapy

## 2014-11-10 DIAGNOSIS — R279 Unspecified lack of coordination: Secondary | ICD-10-CM | POA: Insufficient documentation

## 2014-11-10 DIAGNOSIS — R29898 Other symptoms and signs involving the musculoskeletal system: Secondary | ICD-10-CM | POA: Insufficient documentation

## 2014-11-10 DIAGNOSIS — I6980 Unspecified sequelae of other cerebrovascular disease: Secondary | ICD-10-CM | POA: Diagnosis not present

## 2014-11-10 DIAGNOSIS — IMO0002 Reserved for concepts with insufficient information to code with codable children: Secondary | ICD-10-CM

## 2014-11-10 DIAGNOSIS — M6289 Other specified disorders of muscle: Secondary | ICD-10-CM | POA: Insufficient documentation
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
## 2014-11-12 ENCOUNTER — Ambulatory Visit: Payer: Medicare Other | Admitting: Occupational Therapy

## 2014-11-12 ENCOUNTER — Encounter: Payer: Self-pay | Admitting: Occupational Therapy

## 2014-11-12 DIAGNOSIS — IMO0002 Reserved for concepts with insufficient information to code with codable children: Secondary | ICD-10-CM

## 2014-11-12 DIAGNOSIS — R29898 Other symptoms and signs involving the musculoskeletal system: Secondary | ICD-10-CM

## 2014-11-12 DIAGNOSIS — I6980 Unspecified sequelae of other cerebrovascular disease: Secondary | ICD-10-CM | POA: Diagnosis not present

## 2014-11-12 NOTE — Patient Instructions (Signed)
  Coordination Activities  Perform the following activities for 15 minutes 1-2 times per day with right hand(s).   Rotate ball in fingertips (clockwise and counter-clockwise).  Deal cards with your thumb (Hold deck in hand and push card off top with thumb).  Rotate card in hand (clockwise and counter-clockwise).  Pick up coins one at a time until you get 5-10 in your hand, then move coins from palm to fingertips to stack one at a time.  Twirl pen between fingers.  Practice writing and/or typing.    1. Grip Strengthening (Resistive Putty)   Squeeze putty using thumb and all fingers. Repeat _20___ times. Do __2__ sessions per day.   2. Roll putty into tube on table and pinch between each finger and thumb x 10 reps each. (Do ring and small finger together)     Strengthening: Resisted Flexion   Hold tubing with __Rt___ arm(s) at side. Pull forward and up. Move shoulder through pain-free range of motion. Repeat __10__ times per set.  Do _1-2_ sessions per day , every other day   Strengthening: Resisted Extension   Hold tubing in _Rt____ hand(s), arm forward. Pull arm back, elbow straight. Repeat _10___ times per set. Do _1-2___ sessions per day, every other day.   Resisted Horizontal Abduction: Bilateral   Sit or stand, tubing in both hands, arms out in front. Keeping arms straight, pinch shoulder blades together and stretch arms out. Repeat _10___ times per set. Do _1-2___ sessions per day, every other day.   Elbow Flexion: Resisted   With tubing held in __Rt____ hand(s) and other end secured under foot, curl arm up as far as possible. Repeat _10___ times per set. Do _1-2___ sessions per day, every other day.    Elbow Extension: Resisted   Sit in chair with resistive band secured at armrest (or hold with other hand) and __Rt_____ elbow bent. Straighten elbow. Repeat _10___ times per set.  Do _1-2___ sessions per day, every other day.   Copyright  VHI.  All rights reserved.
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
## 2014-11-17 ENCOUNTER — Ambulatory Visit: Payer: Medicare Other | Admitting: Occupational Therapy

## 2014-11-18 ENCOUNTER — Ambulatory Visit: Payer: Medicare Other | Admitting: Occupational Therapy

## 2014-11-23 ENCOUNTER — Ambulatory Visit: Payer: Medicare Other | Attending: General Practice | Admitting: Occupational Therapy

## 2014-11-23 ENCOUNTER — Encounter: Payer: Self-pay | Admitting: Occupational Therapy

## 2014-11-23 DIAGNOSIS — IMO0002 Reserved for concepts with insufficient information to code with codable children: Secondary | ICD-10-CM

## 2014-11-23 DIAGNOSIS — M6289 Other specified disorders of muscle: Secondary | ICD-10-CM | POA: Insufficient documentation

## 2014-11-23 DIAGNOSIS — I6980 Unspecified sequelae of other cerebrovascular disease: Secondary | ICD-10-CM | POA: Insufficient documentation

## 2014-11-23 DIAGNOSIS — R269 Unspecified abnormalities of gait and mobility: Secondary | ICD-10-CM | POA: Insufficient documentation

## 2014-11-23 DIAGNOSIS — R279 Unspecified lack of coordination: Secondary | ICD-10-CM | POA: Insufficient documentation

## 2014-11-23 DIAGNOSIS — R29898 Other symptoms and signs involving the musculoskeletal system: Secondary | ICD-10-CM | POA: Insufficient documentation
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
## 2014-11-27 ENCOUNTER — Encounter: Payer: Self-pay | Admitting: Occupational Therapy

## 2014-11-27 ENCOUNTER — Ambulatory Visit: Payer: Medicare Other | Admitting: Occupational Therapy

## 2014-11-27 DIAGNOSIS — IMO0002 Reserved for concepts with insufficient information to code with codable children: Secondary | ICD-10-CM

## 2014-11-27 DIAGNOSIS — I6980 Unspecified sequelae of other cerebrovascular disease: Secondary | ICD-10-CM | POA: Diagnosis not present

## 2014-11-27 DIAGNOSIS — R29898 Other symptoms and signs involving the musculoskeletal system: Secondary | ICD-10-CM
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
## 2014-12-01 ENCOUNTER — Encounter: Payer: Self-pay | Admitting: Occupational Therapy

## 2014-12-01 ENCOUNTER — Ambulatory Visit: Payer: Medicare Other | Admitting: Rehabilitative and Restorative Service Providers"

## 2014-12-01 ENCOUNTER — Ambulatory Visit: Payer: Medicare Other | Admitting: Occupational Therapy

## 2014-12-01 DIAGNOSIS — I6980 Unspecified sequelae of other cerebrovascular disease: Secondary | ICD-10-CM | POA: Diagnosis not present

## 2014-12-01 DIAGNOSIS — R29898 Other symptoms and signs involving the musculoskeletal system: Secondary | ICD-10-CM

## 2014-12-01 DIAGNOSIS — IMO0002 Reserved for concepts with insufficient information to code with codable children: Secondary | ICD-10-CM

## 2014-12-01 DIAGNOSIS — R269 Unspecified abnormalities of gait and mobility: Secondary | ICD-10-CM
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
## 2014-12-03 ENCOUNTER — Ambulatory Visit: Payer: Medicare Other | Admitting: Occupational Therapy

## 2014-12-08 ENCOUNTER — Ambulatory Visit: Payer: Medicare Other | Admitting: Occupational Therapy

## 2014-12-11 ENCOUNTER — Encounter: Payer: Non-veteran care | Admitting: Occupational Therapy

## 2014-12-15 ENCOUNTER — Ambulatory Visit: Payer: Medicare Other | Admitting: Diagnostic Neuroimaging

## 2014-12-16 ENCOUNTER — Encounter: Payer: Self-pay | Admitting: Diagnostic Neuroimaging

## 2014-12-31 ENCOUNTER — Emergency Department (HOSPITAL_COMMUNITY): Payer: Medicare Other

## 2014-12-31 ENCOUNTER — Encounter (HOSPITAL_COMMUNITY): Payer: Self-pay | Admitting: *Deleted

## 2014-12-31 ENCOUNTER — Observation Stay (HOSPITAL_COMMUNITY)
Admission: EM | Admit: 2014-12-31 | Discharge: 2015-01-01 | Disposition: A | Discharge status: Home / Self Care | Payer: Medicare Other | Attending: Internal Medicine | Admitting: Internal Medicine

## 2014-12-31 DIAGNOSIS — N183 Chronic kidney disease, stage 3 unspecified: Secondary | ICD-10-CM | POA: Diagnosis present

## 2014-12-31 DIAGNOSIS — R200 Anesthesia of skin: Principal | ICD-10-CM | POA: Insufficient documentation

## 2014-12-31 DIAGNOSIS — F4310 Post-traumatic stress disorder, unspecified: Secondary | ICD-10-CM | POA: Insufficient documentation

## 2014-12-31 DIAGNOSIS — R51 Headache: Secondary | ICD-10-CM | POA: Insufficient documentation

## 2014-12-31 DIAGNOSIS — E860 Dehydration: Secondary | ICD-10-CM

## 2014-12-31 DIAGNOSIS — I129 Hypertensive chronic kidney disease with stage 1 through stage 4 chronic kidney disease, or unspecified chronic kidney disease: Secondary | ICD-10-CM | POA: Diagnosis not present

## 2014-12-31 DIAGNOSIS — Z79899 Other long term (current) drug therapy: Secondary | ICD-10-CM | POA: Insufficient documentation

## 2014-12-31 DIAGNOSIS — Z7902 Long term (current) use of antithrombotics/antiplatelets: Secondary | ICD-10-CM | POA: Diagnosis not present

## 2014-12-31 DIAGNOSIS — Z87828 Personal history of other (healed) physical injury and trauma: Secondary | ICD-10-CM | POA: Diagnosis not present

## 2014-12-31 DIAGNOSIS — I2510 Atherosclerotic heart disease of native coronary artery without angina pectoris: Secondary | ICD-10-CM | POA: Insufficient documentation

## 2014-12-31 DIAGNOSIS — E109 Type 1 diabetes mellitus without complications: Secondary | ICD-10-CM | POA: Insufficient documentation

## 2014-12-31 DIAGNOSIS — R079 Chest pain, unspecified: Secondary | ICD-10-CM | POA: Diagnosis not present

## 2014-12-31 DIAGNOSIS — Z87891 Personal history of nicotine dependence: Secondary | ICD-10-CM | POA: Insufficient documentation

## 2014-12-31 DIAGNOSIS — Z9889 Other specified postprocedural states: Secondary | ICD-10-CM | POA: Insufficient documentation

## 2014-12-31 DIAGNOSIS — E1021 Type 1 diabetes mellitus with diabetic nephropathy: Secondary | ICD-10-CM | POA: Diagnosis not present

## 2014-12-31 DIAGNOSIS — Z794 Long term (current) use of insulin: Secondary | ICD-10-CM | POA: Insufficient documentation

## 2014-12-31 DIAGNOSIS — R260 Ataxic gait: Secondary | ICD-10-CM | POA: Insufficient documentation

## 2014-12-31 DIAGNOSIS — E1029 Type 1 diabetes mellitus with other diabetic kidney complication: Secondary | ICD-10-CM | POA: Diagnosis present

## 2014-12-31 DIAGNOSIS — F603 Borderline personality disorder: Secondary | ICD-10-CM | POA: Insufficient documentation

## 2014-12-31 DIAGNOSIS — R202 Paresthesia of skin: Secondary | ICD-10-CM

## 2014-12-31 LAB — URINALYSIS, ROUTINE W REFLEX MICROSCOPIC
BILIRUBIN URINE: NEGATIVE
GLUCOSE, UA: NEGATIVE mg/dL
Ketones, ur: NEGATIVE mg/dL
Leukocytes, UA: NEGATIVE
Nitrite: NEGATIVE
PROTEIN: NEGATIVE mg/dL
Specific Gravity, Urine: 1.01 (ref 1.005–1.030)
UROBILINOGEN UA: 1 mg/dL (ref 0.0–1.0)
pH: 5.5 (ref 5.0–8.0)

## 2014-12-31 LAB — CBC WITH DIFFERENTIAL/PLATELET
BASOS PCT: 1 % (ref 0–1)
Basophils Absolute: 0 10*3/uL (ref 0.0–0.1)
EOS ABS: 0.1 10*3/uL (ref 0.0–0.7)
Eosinophils Relative: 1 % (ref 0–5)
HEMATOCRIT: 42.6 % (ref 39.0–52.0)
HEMOGLOBIN: 15 g/dL (ref 13.0–17.0)
Lymphocytes Relative: 16 % (ref 12–46)
Lymphs Abs: 0.9 10*3/uL (ref 0.7–4.0)
MCH: 33 pg (ref 26.0–34.0)
MCHC: 35.2 g/dL (ref 30.0–36.0)
MCV: 93.6 fL (ref 78.0–100.0)
MONO ABS: 0.5 10*3/uL (ref 0.1–1.0)
Monocytes Relative: 8 % (ref 3–12)
Neutro Abs: 4.3 10*3/uL (ref 1.7–7.7)
Neutrophils Relative %: 74 % (ref 43–77)
PLATELETS: 237 10*3/uL (ref 150–400)
RBC: 4.55 MIL/uL (ref 4.22–5.81)
RDW: 12.9 % (ref 11.5–15.5)
WBC: 5.8 10*3/uL (ref 4.0–10.5)

## 2014-12-31 LAB — URINE MICROSCOPIC-ADD ON

## 2014-12-31 LAB — COMPREHENSIVE METABOLIC PANEL
ALBUMIN: 4.7 g/dL (ref 3.5–5.0)
ALK PHOS: 111 U/L (ref 38–126)
ALT: 16 U/L — ABNORMAL LOW (ref 17–63)
ANION GAP: 10 (ref 5–15)
AST: 25 U/L (ref 15–41)
BUN: 20 mg/dL (ref 6–20)
CHLORIDE: 102 mmol/L (ref 101–111)
CO2: 25 mmol/L (ref 22–32)
Calcium: 9.6 mg/dL (ref 8.9–10.3)
Creatinine, Ser: 1.95 mg/dL — ABNORMAL HIGH (ref 0.61–1.24)
GFR calc non Af Amer: 40 mL/min — ABNORMAL LOW (ref >60)
GFR, EST AFRICAN AMERICAN: 46 mL/min — AB (ref >60)
Glucose, Bld: 168 mg/dL — ABNORMAL HIGH (ref 65–99)
POTASSIUM: 4.1 mmol/L (ref 3.5–5.1)
SODIUM: 137 mmol/L (ref 135–145)
Total Bilirubin: 0.8 mg/dL (ref 0.3–1.2)
Total Protein: 7.9 g/dL (ref 6.5–8.1)

## 2014-12-31 LAB — TROPONIN I

## 2014-12-31 LAB — GLUCOSE, CAPILLARY: GLUCOSE-CAPILLARY: 143 mg/dL — AB (ref 65–99)

## 2014-12-31 LAB — I-STAT TROPONIN, ED: TROPONIN I, POC: 0.01 ng/mL (ref 0.00–0.08)

## 2014-12-31 MED ORDER — INSULIN PUMP
1.0000 | SUBCUTANEOUS | Status: DC
  Filled 2014-12-31: qty 1

## 2014-12-31 MED ORDER — HYDRALAZINE HCL 25 MG PO TABS
25.0000 mg | ORAL_TABLET | Freq: Three times a day (TID) | ORAL | Status: DC
  Administered 2014-12-31 – 2015-01-01 (×3): 25 mg via ORAL
  Filled 2014-12-31 (×3): qty 1

## 2014-12-31 MED ORDER — LORAZEPAM 2 MG/ML IJ SOLN
1.0000 mg | Freq: Once | INTRAMUSCULAR | Status: AC
  Administered 2014-12-31: 1 mg via INTRAVENOUS
  Filled 2014-12-31: qty 1

## 2014-12-31 MED ORDER — POLYVINYL ALCOHOL 1.4 % OP SOLN: 1.0000 [drp] | OPHTHALMIC | Status: DC | PRN

## 2014-12-31 MED ORDER — NITROGLYCERIN 0.4 MG SL SUBL: 0.4000 mg | SUBLINGUAL_TABLET | SUBLINGUAL | Status: DC | PRN

## 2014-12-31 MED ORDER — AMLODIPINE BESYLATE 5 MG PO TABS
10.0000 mg | ORAL_TABLET | Freq: Every day | ORAL | Status: DC
  Administered 2015-01-01: 10 mg via ORAL
  Filled 2014-12-31: qty 2

## 2014-12-31 MED ORDER — ENOXAPARIN SODIUM 40 MG/0.4ML ~~LOC~~ SOLN: 40.0000 mg | SUBCUTANEOUS | Status: DC

## 2014-12-31 MED ORDER — INSULIN ASPART 100 UNIT/ML ~~LOC~~ SOLN: 0.0000 [IU] | Freq: Every day | SUBCUTANEOUS | Status: DC

## 2014-12-31 MED ORDER — LISINOPRIL 10 MG PO TABS
10.0000 mg | ORAL_TABLET | Freq: Every day | ORAL | Status: DC
  Administered 2015-01-01: 10 mg via ORAL
  Filled 2014-12-31: qty 1

## 2014-12-31 MED ORDER — ONDANSETRON HCL 4 MG/2ML IJ SOLN: 4.0000 mg | Freq: Four times a day (QID) | INTRAMUSCULAR | Status: DC | PRN

## 2014-12-31 MED ORDER — SODIUM CHLORIDE 0.9 % IJ SOLN
3.0000 mL | Freq: Two times a day (BID) | INTRAMUSCULAR | Status: DC
  Administered 2014-12-31: 3 mL via INTRAVENOUS

## 2014-12-31 MED ORDER — METOPROLOL TARTRATE 50 MG PO TABS
50.0000 mg | ORAL_TABLET | Freq: Two times a day (BID) | ORAL | Status: DC
  Administered 2014-12-31 – 2015-01-01 (×2): 50 mg via ORAL
  Filled 2014-12-31 (×2): qty 1

## 2014-12-31 MED ORDER — VITAMIN D 1000 UNITS PO TABS
1000.0000 [IU] | ORAL_TABLET | Freq: Every day | ORAL | Status: DC
  Administered 2015-01-01: 1000 [IU] via ORAL
  Filled 2014-12-31 (×2): qty 1

## 2014-12-31 MED ORDER — SODIUM CHLORIDE 0.9 % IV SOLN
INTRAVENOUS | Status: DC
  Administered 2014-12-31 – 2015-01-01 (×2): 1 mL via INTRAVENOUS

## 2014-12-31 MED ORDER — ASPIRIN 81 MG PO CHEW
324.0000 mg | CHEWABLE_TABLET | Freq: Once | ORAL | Status: AC
  Administered 2014-12-31: 324 mg via ORAL
  Filled 2014-12-31: qty 4

## 2014-12-31 MED ORDER — OXYCODONE HCL 5 MG PO TABS
5.0000 mg | ORAL_TABLET | Freq: Once | ORAL | Status: AC
  Administered 2014-12-31: 5 mg via ORAL
  Filled 2014-12-31: qty 1

## 2014-12-31 MED ORDER — TRAZODONE HCL 50 MG PO TABS
100.0000 mg | ORAL_TABLET | Freq: Every day | ORAL | Status: DC
  Administered 2014-12-31: 100 mg via ORAL
  Filled 2014-12-31: qty 2

## 2014-12-31 MED ORDER — ALPRAZOLAM 0.25 MG PO TABS: 0.2500 mg | ORAL_TABLET | Freq: Two times a day (BID) | ORAL | Status: DC | PRN

## 2014-12-31 MED ORDER — SIMVASTATIN 20 MG PO TABS: 20.0000 mg | ORAL_TABLET | Freq: Every evening | ORAL | Status: DC

## 2014-12-31 MED ORDER — OMEGA-3-ACID ETHYL ESTERS 1 G PO CAPS
2.0000 g | ORAL_CAPSULE | Freq: Two times a day (BID) | ORAL | Status: DC
  Administered 2014-12-31 – 2015-01-01 (×2): 2 g via ORAL
  Filled 2014-12-31 (×2): qty 2

## 2014-12-31 MED ORDER — PAROXETINE HCL 20 MG PO TABS
60.0000 mg | ORAL_TABLET | Freq: Every morning | ORAL | Status: DC
  Administered 2015-01-01: 60 mg via ORAL
  Filled 2014-12-31: qty 3
  Filled 2014-12-31: qty 2

## 2014-12-31 MED ORDER — INSULIN ASPART 100 UNIT/ML ~~LOC~~ SOLN: 0.0000 [IU] | Freq: Three times a day (TID) | SUBCUTANEOUS | Status: DC

## 2014-12-31 MED ORDER — OMEGA-3 FATTY ACIDS 1000 MG PO CAPS: 2.0000 g | ORAL_CAPSULE | Freq: Two times a day (BID) | ORAL | Status: DC

## 2014-12-31 MED ORDER — ZOLPIDEM TARTRATE 5 MG PO TABS
10.0000 mg | ORAL_TABLET | Freq: Every day | ORAL | Status: DC
  Administered 2014-12-31: 10 mg via ORAL
  Filled 2014-12-31: qty 2

## 2014-12-31 MED ORDER — CLOPIDOGREL BISULFATE 75 MG PO TABS
75.0000 mg | ORAL_TABLET | Freq: Every day | ORAL | Status: DC
  Administered 2015-01-01: 75 mg via ORAL
  Filled 2014-12-31: qty 1

## 2014-12-31 MED ORDER — ONDANSETRON HCL 4 MG PO TABS: 4.0000 mg | ORAL_TABLET | Freq: Four times a day (QID) | ORAL | Status: DC | PRN
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
## 2014-12-31 NOTE — ED Notes (Signed)
CVA on 10/03/14 leaving residual R lateral hand numbness.  Awoke this morning w/numbness of entire R arm to shoulder. Denies slurred speech, disequilibrium or vision changes.

## 2014-12-31 NOTE — ED Notes (Signed)
Patient complaining of right arm pain. Dr Dalene SeltzerSchlossman notified and at bedside.
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
## 2015-01-01 DIAGNOSIS — R079 Chest pain, unspecified: Secondary | ICD-10-CM | POA: Diagnosis not present

## 2015-01-01 DIAGNOSIS — E1029 Type 1 diabetes mellitus with other diabetic kidney complication: Secondary | ICD-10-CM | POA: Diagnosis not present

## 2015-01-01 DIAGNOSIS — N183 Chronic kidney disease, stage 3 (moderate): Secondary | ICD-10-CM | POA: Diagnosis not present

## 2015-01-01 DIAGNOSIS — R200 Anesthesia of skin: Secondary | ICD-10-CM | POA: Diagnosis not present

## 2015-01-01 LAB — CBC
HCT: 43.7 % (ref 39.0–52.0)
Hemoglobin: 14.9 g/dL (ref 13.0–17.0)
MCH: 32.2 pg (ref 26.0–34.0)
MCHC: 34.1 g/dL (ref 30.0–36.0)
MCV: 94.4 fL (ref 78.0–100.0)
Platelets: 166 10*3/uL (ref 150–400)
RBC: 4.63 MIL/uL (ref 4.22–5.81)
RDW: 13 % (ref 11.5–15.5)
WBC: 5.9 10*3/uL (ref 4.0–10.5)

## 2015-01-01 LAB — COMPREHENSIVE METABOLIC PANEL
ALBUMIN: 4.4 g/dL (ref 3.5–5.0)
ALT: 14 U/L — ABNORMAL LOW (ref 17–63)
AST: 20 U/L (ref 15–41)
Alkaline Phosphatase: 89 U/L (ref 38–126)
Anion gap: 12 (ref 5–15)
BUN: 21 mg/dL — AB (ref 6–20)
CO2: 23 mmol/L (ref 22–32)
CREATININE: 1.78 mg/dL — AB (ref 0.61–1.24)
Calcium: 8.8 mg/dL — ABNORMAL LOW (ref 8.9–10.3)
Chloride: 103 mmol/L (ref 101–111)
GFR calc Af Amer: 51 mL/min — ABNORMAL LOW (ref >60)
GFR, EST NON AFRICAN AMERICAN: 44 mL/min — AB (ref >60)
Glucose, Bld: 178 mg/dL — ABNORMAL HIGH (ref 65–99)
Potassium: 3.6 mmol/L (ref 3.5–5.1)
Sodium: 138 mmol/L (ref 135–145)
Total Bilirubin: 1.1 mg/dL (ref 0.3–1.2)
Total Protein: 7.3 g/dL (ref 6.5–8.1)

## 2015-01-01 LAB — GLUCOSE, CAPILLARY
Glucose-Capillary: 36 mg/dL — CL (ref 65–99)
Glucose-Capillary: 55 mg/dL — ABNORMAL LOW (ref 65–99)
Glucose-Capillary: 249 mg/dL — ABNORMAL HIGH (ref 65–99)
Glucose-Capillary: 338 mg/dL — ABNORMAL HIGH (ref 65–99)
Glucose-Capillary: 371 mg/dL — ABNORMAL HIGH (ref 65–99)

## 2015-01-01 LAB — TROPONIN I: Troponin I: <0.03 ng/mL (ref <0.031)

## 2015-01-01 MED ORDER — DEXTROSE 50 % IV SOLN
INTRAVENOUS | Status: AC
  Filled 2015-01-01: qty 50

## 2015-01-01 MED ORDER — DEXTROSE 50 % IV SOLN
INTRAVENOUS | Status: AC
  Administered 2015-01-01: 25 mL via INTRAVENOUS
  Filled 2015-01-01: qty 50

## 2015-01-01 MED ORDER — GLUCOSE 40 % PO GEL
1.0000 | Freq: Once | ORAL | Status: AC
  Administered 2015-01-01: 37.5 g via ORAL

## 2015-01-01 MED ORDER — GLUCOSE 40 % PO GEL
ORAL | Status: AC
  Administered 2015-01-01: 37.5 g via ORAL
  Filled 2015-01-01: qty 1

## 2015-01-01 MED ORDER — INSULIN PUMP
Freq: Three times a day (TID) | SUBCUTANEOUS | Status: DC
  Administered 2015-01-01: 3.45 via SUBCUTANEOUS
  Administered 2015-01-01: 4.3 via SUBCUTANEOUS

## 2015-01-01 MED ORDER — DEXTROSE 50 % IV SOLN
25.0000 mL | INTRAVENOUS | Status: AC
  Administered 2015-01-01: 25 mL via INTRAVENOUS

## 2015-01-01 NOTE — Progress Notes (Signed)
Hypoglycemic Event  CBG: 36 at 0528  Treatment: 1 tube instant glucose  Symptoms: Sweaty, Shaky and Nervous/irritable  Follow-up CBG: Time: 0615 CBG Result:55  Possible Reasons for Event: Inadequate meal intake  Comments/MD notified: Patient has an insulin pump and has had little PO intake since admission.  Dr. Selena BattenKim notified of the low CBG at recheck and orders received and carried out see MAR.  Repeat CBG=249 at [phone].  Patient in bed and denies complaint at this time. Nursing staff to continue to monitor.    Gladis RiffleBullock, Donte Lenzo Lewis  Remember to initiate Hypoglycemia Order Set & complete

## 2015-01-01 NOTE — Discharge Summary (Signed)
Physician Discharge Summary  Aaron Curry ZOX:[id number] DOB: [birth year] DOA: 12/31/2014  PCP: No primary care provider on file.  Admit date: 12/31/2014 Discharge date: 01/01/2015  Time spent: 40 minutes  Recommendations for Outpatient Follow-up:  1. Patient will plan on [facility] up [facility]  Discharge Diagnoses:  Active Problems:   Dehydration   DM type 1 causing renal disease   Chronic renal disease, stage III   Chest pain   Numbness and tingling of right arm   Pain in the chest   Discharge Condition: Improved  Diet recommendation: Low salt, low carb  Filed Weights   12/31/14 2356  Weight: 64.6 kg (142 lb 6.7 oz)    History of present illness:  This patient presents to the hospital with right arm paresthesias and numbness as well as chest pain. He has a history of coronary disease as well as prior stroke. He was admitted for further evaluation.  Hospital Course:  Patient ruled out for ACS with negative cardiac markers and no new EKG changes. He does not have any further discomfort after being admitted to the hospital. He'll be continued on his regular anti-platelet agents.  Regarding his right upper extremity paresthesias/numbness, MRI of the brain was in the emergency room that did not show any acute infarct. He was monitored in the hospital overnight. He reports that he does have residual numbness/paresthesias in his right arm since his prior stroke. The symptoms had gotten slightly worse on admission, but now are back to baseline. Further workup was offered including MRI of the C-spine, the patient has refused. He wishes to follow-up with his primary neurologist at the [facility] on 7/18. Patient's symptoms appear to be back to baseline and he is otherwise in stable condition. He'll be continued on it platelet agents as well as his statin and insulin for diabetes. He'll be discharged home  today.  Procedures:    Consultations:    Discharge Exam: Filed Vitals:   01/01/15 1402  BP: 179/98  Pulse:   Temp:   Resp:     General: No acute distress Cardiovascular: S1, S2, regular rate and rhythm Respiratory: Clear to auscultation bilaterally Neurologic: 5 out of 5 strength bilaterally in upper and lower extremities. Cranial nerves are grossly intact. Some numbness and paresthesias in the right forearm  Discharge Instructions   Discharge Instructions    Diet - low sodium heart healthy    Complete by:  As directed      Diet Carb Modified    Complete by:  As directed      Increase activity slowly    Complete by:  As directed           Discharge Medication List as of 01/01/2015  1:47 PM    CONTINUE these medications which have NOT CHANGED   Details  ALPRAZolam (XANAX) 0.25 MG tablet Take 0.25 mg by mouth 2 (two) times daily as needed for anxiety., Until Discontinued, Historical Med    amLODipine (NORVASC) 10 MG tablet Take 10 mg by mouth daily., Until Discontinued, Historical Med    cholecalciferol (VITAMIN D) 1000 UNITS tablet Take 1,000 Units by mouth daily., Until Discontinued, Historical Med    clopidogrel (PLAVIX) 75 MG tablet Take 1 tablet (75 mg total) by mouth daily., Starting 10/05/2014, Until Discontinued, Print    fish oil-omega-3 fatty acids 1000 MG capsule Take 2 g by mouth 2 (two) times daily., Until Discontinued, Historical Med    hydrALAZINE (APRESOLINE) 25  MG tablet Take 1 tablet (25 mg total) by mouth every 8 (eight) hours., Starting 03/21/2014, Until Discontinued, Print    insulin aspart (NOVOLOG) 100 UNIT/ML injection Inject into the skin See admin instructions. WUJ:WJXB-1 unit per 13 Carbs. Insulin Sensitivity= 1 unit per 45mg /dL Basal= 4.782N/FA, Until Discontinued, Historical Med    Insulin Human (INSULIN PUMP) SOLN Inject 1 each into the skin continuous., Until Discontinued, Historical Med    lisinopril (PRINIVIL,ZESTRIL) 10 MG tablet  Take 1 tablet (10 mg total) by mouth daily., Starting 03/21/2014, Until Discontinued, Print    metoprolol (LOPRESSOR) 50 MG tablet Take 1 tablet (50 mg total) by mouth 2 (two) times daily., Starting 05/23/2013, Until Discontinued, Print    nitroGLYCERIN (NITROSTAT) 0.4 MG SL tablet Place 1 tablet (0.4 mg total) under the tongue every 5 (five) minutes as needed for chest pain (severe chest pain or pressure)., Starting 03/21/2014, Until Discontinued, Print    PARoxetine (PAXIL) 40 MG tablet Take 60 mg by mouth every morning., Until Discontinued, Historical Med    polyvinyl alcohol (LIQUIFILM TEARS) 1.4 % ophthalmic solution Place 1 drop into both eyes as needed for dry eyes., Until Discontinued, Historical Med    simvastatin (ZOCOR) 20 MG tablet Take 1 tablet (20 mg total) by mouth every evening., Starting 10/05/2014, Until Discontinued, Print    traZODone (DESYREL) 100 MG tablet Take 100 mg by mouth at bedtime. , Until Discontinued, Historical Med    zolpidem (AMBIEN) 10 MG tablet Take 10 mg by mouth at bedtime., Until Discontinued, Historical Med       Allergies  Allergen Reactions  . Tylenol [Acetaminophen] Other (See Comments)    Due to 24 hr glucose monitoring.   Follow-up Information    Follow up with follow up with your neurologist on monday.      Follow up with Beryle Beams, MD On 01/04/2015.   Specialty:  Neurology   Why:  at 9 am   Contact information:   [address] [place] [address] [phone]        The results of significant diagnostics from this hospitalization (including imaging, microbiology, ancillary and laboratory) are listed below for reference.    Significant Diagnostic Studies: Dg Chest 2 View  12/31/2014   CLINICAL DATA:  Chest pain  EXAM: CHEST  2 VIEW  COMPARISON:  03/21/2014  FINDINGS: Cardiomediastinal silhouette is stable. Old left rib fractures are stable no acute infiltrate or pleural effusion. No pulmonary edema. Bony thorax is stable.   IMPRESSION: No active cardiopulmonary disease.   Electronically Signed   By: Natasha Mead M.D.   On: 12/31/2014 14:52   Mr Brain Wo Contrast (neuro Protocol)  12/31/2014   CLINICAL DATA:  44 year old male who awoke this morning with right arm and hand numbness. Initial encounter. Personal history of left thalamic lacunar infarct earlier this year.  EXAM: MRI HEAD WITHOUT CONTRAST  TECHNIQUE: Multiplanar, multiecho pulse sequences of the brain and surrounding structures were obtained without intravenous contrast.  COMPARISON:  Brain MRI and intracranial MRA 10/04/2014.  FINDINGS: Major intracranial vascular flow voids are stable. No restricted diffusion to suggest acute infarction. No midline shift, mass effect, evidence of mass lesion, ventriculomegaly, extra-axial collection or acute intracranial hemorrhage. Cervicomedullary junction and pituitary are within normal limits. Expected evolution of the small left thalamic lacunar infarct in April, small focus of residual T2 and FLAIR hyperintensity. No chronic blood products or cortical encephalomalacia identified in the brain. Stable nonspecific white matter signal changes, including in the posterior limb  of the right internal capsule.  Visible internal auditory structures appear normal. Stable paranasal sinuses. Mastoids are clear. Stable orbit and scalp soft tissues. Negative visualized cervical spine. Visualized bone marrow signal is within normal limits.  IMPRESSION: 1.  No acute intracranial abnormality. 2. Expected evolution of the small left thalamic lacune since April.   Electronically Signed   By: Odessa Fleming M.D.   On: 12/31/2014 16:33    Microbiology: No results found for this or any previous visit (from the past 240 hour(s)).   Labs: Basic Metabolic Panel:  Recent Labs Lab 12/31/14 1423 01/01/15 0631  NA 137 138  K 4.1 3.6  CL 102 103  CO2 25 23  GLUCOSE 168* 178*  BUN 20 21*  CREATININE 1.95* 1.78*  CALCIUM 9.6 8.8*   Liver Function  Tests:  Recent Labs Lab 12/31/14 1423 01/01/15 0631  AST 25 20  ALT 16* 14*  ALKPHOS 111 89  BILITOT 0.8 1.1  PROT 7.9 7.3  ALBUMIN 4.7 4.4   No results for input(s): LIPASE, AMYLASE in the last 168 hours. No results for input(s): AMMONIA in the last 168 hours. CBC:  Recent Labs Lab 12/31/14 1423 01/01/15 0631  WBC 5.8 5.9  NEUTROABS 4.3  --   HGB 15.0 14.9  HCT 42.6 43.7  MCV 93.6 94.4  PLT 237 166   Cardiac Enzymes:  Recent Labs Lab 12/31/14 1423 12/31/14 1951 01/01/15 0014 01/01/15 0631  TROPONINI <0.03 <0.03 <0.03 <0.03   BNP: BNP (last 3 results) No results for input(s): BNP in the last 8760 hours.  ProBNP (last 3 results) No results for input(s): PROBNP in the last 8760 hours.  CBG:  Recent Labs Lab 01/01/15 0528 01/01/15 0615 01/01/15 0655 01/01/15 0806 01/01/15 1130  GLUCAP 36* 55* 249* 338* 371*       Signed:  MEMON,JEHANZEB  [facility] 01/01/2015, 7:01 PM

## 2015-01-01 NOTE — Progress Notes (Signed)
Patient discharged with instructions given on medications,and follow up visits,patient verbalized understanding. No c/o pain or discomfort noted. Accompanied by staff to an awaiting vehicle.. 

## 2015-01-01 NOTE — Progress Notes (Signed)
Noted patient uses an insulin pump as an outpatient. Called patient over the phone and talked with him regarding insulin pump and glycemic control. Patient states that he currently has insulin pump connected and plans to use his insulin pump for glycemic control while inpatient. Inquired about any issues with hypoglycemia and patient states that he does not usually have any issues with hypoglycemia at home. Glucose down to 36 mg/dl this morning at 9:605:28 am. Patient states that his last meal was yesterday at lunch. Patient reports that he was not given a supper meal tray and when he was admitted on the floor he was told they only had graham crackers which he ate. Hypoglycemia this morning was treated and glucose is up to 249 mg/dl at 4:546:55 am. Patient states that he done a correction with his insulin pump for glucose of 200 mg/dl (patient used lower glucose in order to prevent his glucose from dropping again) and he plans on eating his breakfast tray when it is received. Will continue to follow. Insulin pump order set ordered and Novolog sliding scale discontinued since insulin pump provides basal insulin and patient will be doing correction and meal coverage with his insulin pump.  NURSING: Please have patient sign insulin pump contract and provide him with patient flow chart which will be filled out by him and used by RN [facility]. Also, please be sure to complete insulin pump assessment once a shift.   Thanks, Orlando PennerMarie Medrith Veillon, RN, MSN, CCRN, CDE Diabetes Coordinator Inpatient Diabetes Program [phone] (Team Pager from 8am to 5pm) [phone] ([facility] office) [phone] [facility] office) [phone] [facility] office)

## 2015-01-03 LAB — GLUCOSE, CAPILLARY: Glucose-Capillary: 149 mg/dL — ABNORMAL HIGH (ref 65–99)

## 2016-05-19 DEATH — deceased

## 2016-06-19 DEATH — deceased

## 2016-09-04 IMAGING — MR MR HEAD W/O CM
9 of 12 series · 32 of 48 positions shown · non-contrast
Comparison: Prior CT from 10/03/2014.

CLINICAL DATA: Initial evaluation for acute onset speech difficulty
with numbness in weakness involving right arm and leg. Status post
tPA.

EXAM:
MRI HEAD WITHOUT CONTRAST
MRA HEAD WITHOUT CONTRAST
TECHNIQUE: Multiplanar, multiecho pulse sequences of the brain and surrounding
structures were obtained without intravenous contrast. Angiographic
images of the head were obtained using MRA technique without
contrast.

[Series 3: T1 · sagittal · 5.0mm · 0.47mm/px · 1 of 23 slices shown]
[im 1/23]
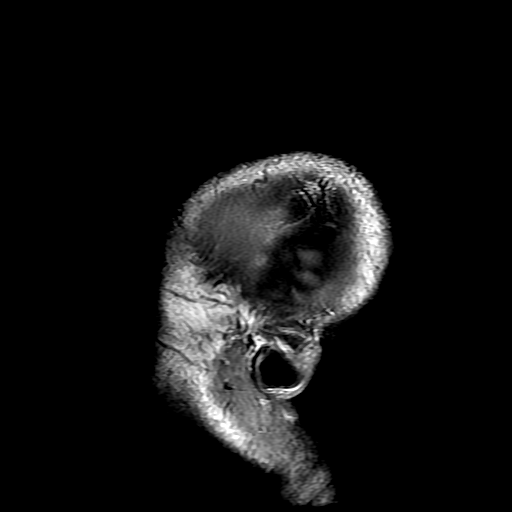

[Series 4: DWI · axial · 3.0mm · 1.09mm/px · z∈[-74,+74]mm · 8 of 102 slices shown (1 of 4)]
[im 1/102]
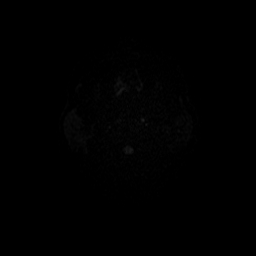
[im 15/102]
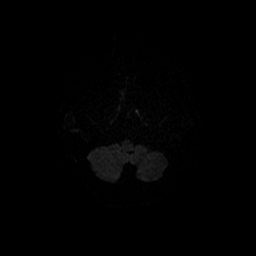
[im 29/102]
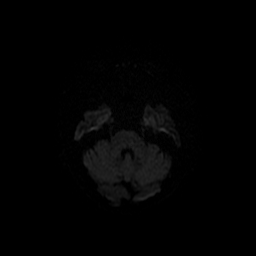
[im 44/102]
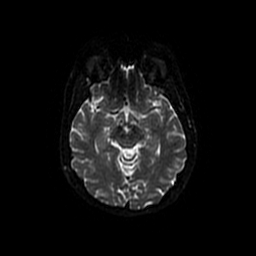
[im 58/102]
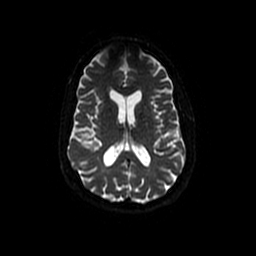
[im 73/102]
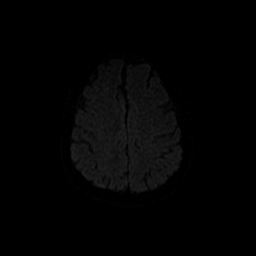
[im 87/102]
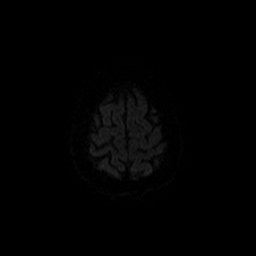
[im 102/102]
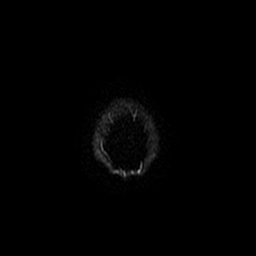

[Series 5: (id) mt fs · axial · 1.4mm · 0.43mm/px · z∈[-93,-44]mm · 5 of 136 slices shown]
[im 1/136]
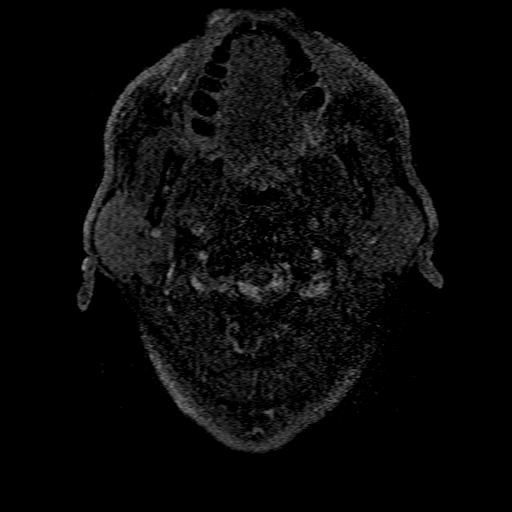
[im 16/136]
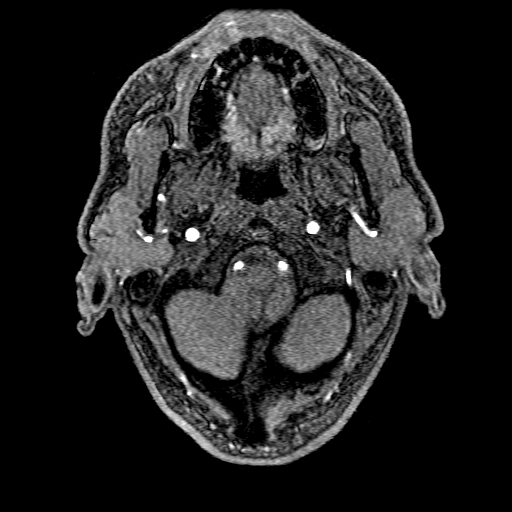
[im 46/136]
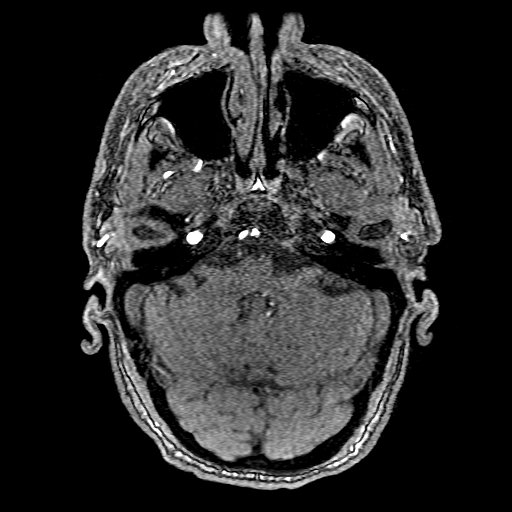
[im 61/136]
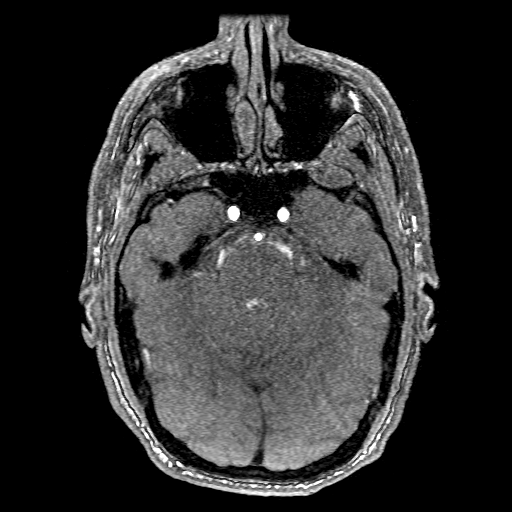
[im 76/136]
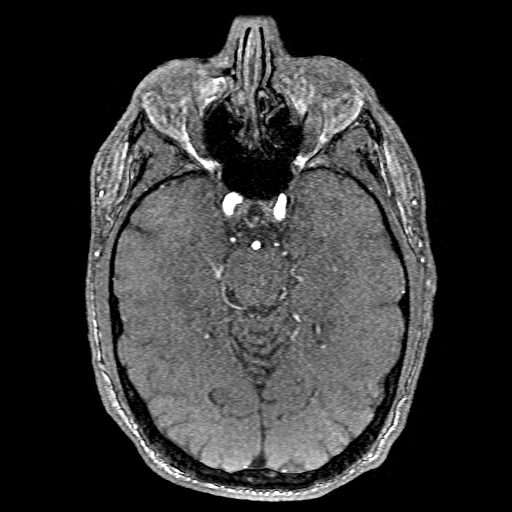

[Series 6: T2 · axial · 5.0mm · 0.43mm/px · z∈[-73,+75]mm · 2 of 26 slices shown (1 of 2)]
[im 1/26]
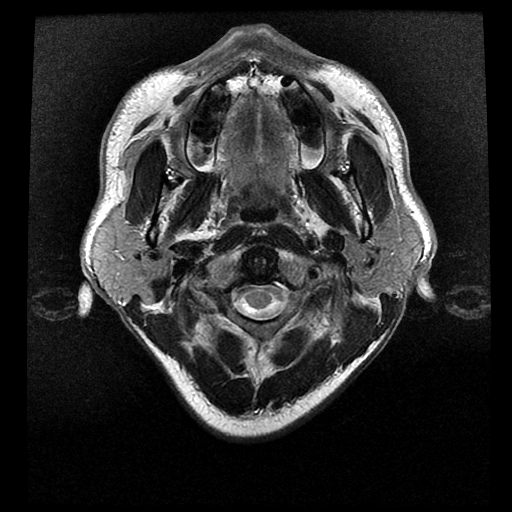
[im 26/26]
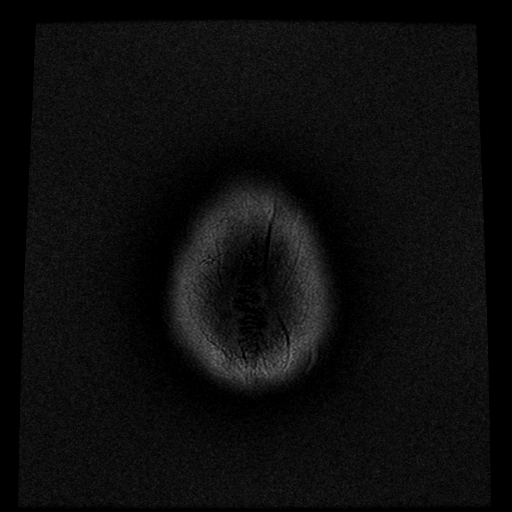

[Series 7: DWI · coronal · 5.0mm · 1.09mm/px · 5 of 68 slices shown (2 of 4)]
[im 1/68]
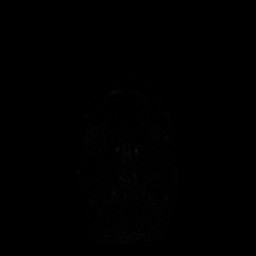
[im 17/68]
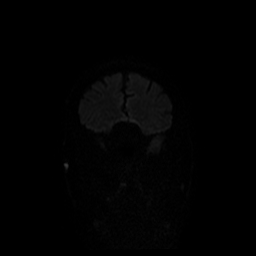
[im 34/68]
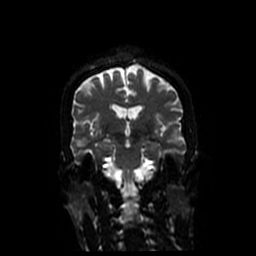
[im 51/68]
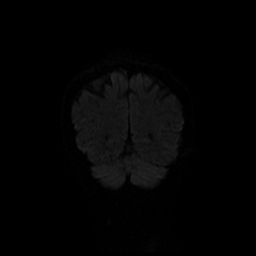
[im 68/68]
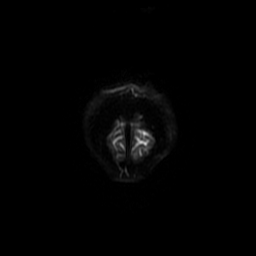

[Series 8: FLAIR · axial · 5.0mm · 0.43mm/px · z∈[-73,+75]mm · 2 of 26 slices shown]
[im 1/26]
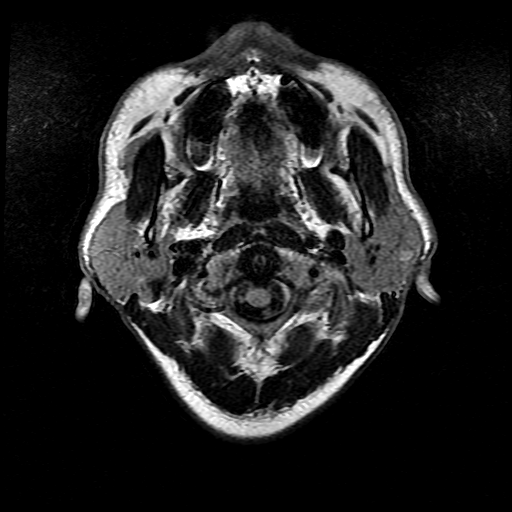
[im 26/26]
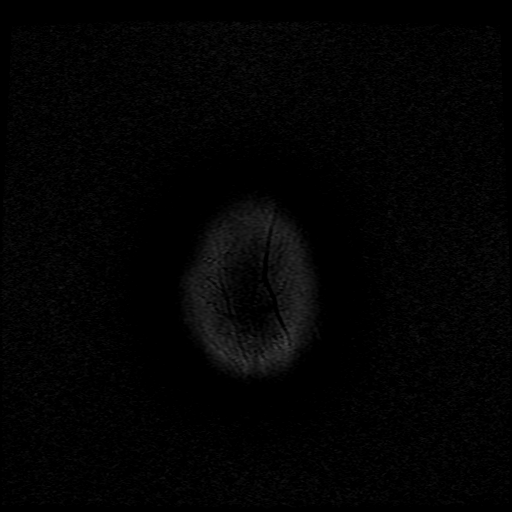

[Series 11: T2 · coronal · 5.0mm · 0.39mm/px · 2 of 26 slices shown (2 of 2)]
[im 1/26]
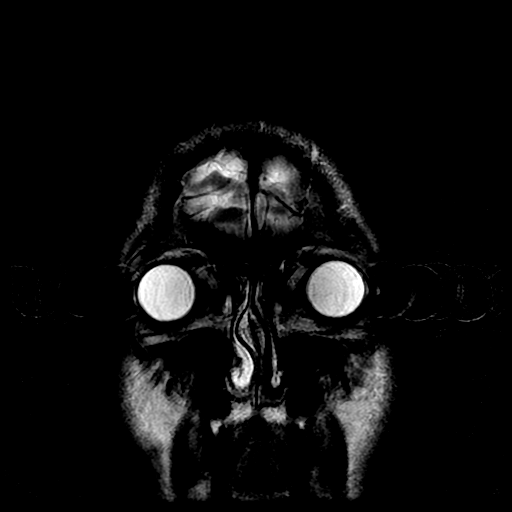
[im 26/26]
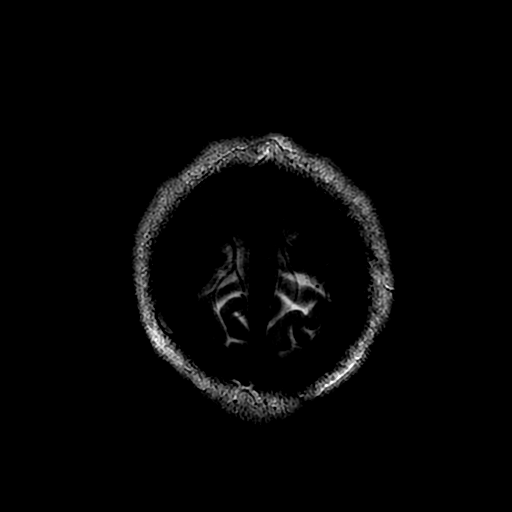

[Series 400: DWI · axial · 3.0mm · 1.09mm/px · z∈[-74,+74]mm · 4 of 51 slices shown (3 of 4)]
[im 1/51]
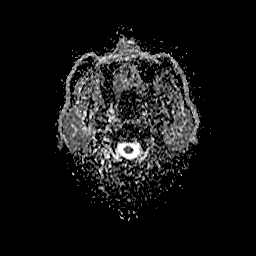
[im 17/51]
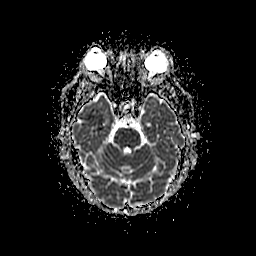
[im 34/51]
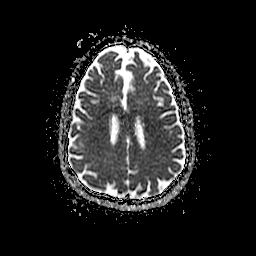
[im 51/51]
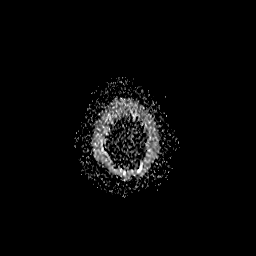

[Series 700: DWI · coronal · 5.0mm · 1.09mm/px · 3 of 34 slices shown (4 of 4)]
[im 1/34]
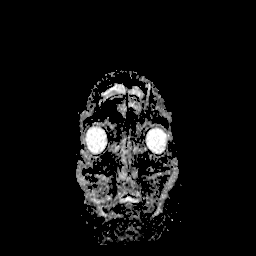
[im 17/34]
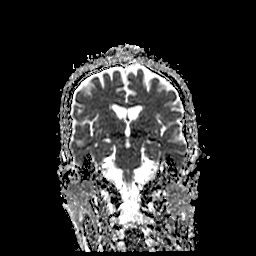
[im 34/34]
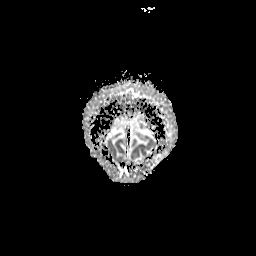

[32 of 48 positions shown; findings below may reference images not displayed]

FINDINGS: MRI HEAD FINDINGS

Cerebral volume within normal limits for patient age. Mild scattered
T2/FLAIR hyperintensity present within the periventricular and deep
white matter, most consistent with chronic small vessel ischemic
disease, fairly mild.

There is a small 5 mm focus of restricted diffusion within the
lateral left thalamus, consistent with a small acute ischemic
infarct (series 4, image 24). No associated mass effect or
hemorrhage.

No other abnormal foci of restricted diffusion to suggest acute
intracranial infarct. Gray-white matter differentiation otherwise
maintained. Normal intravascular flow voids preserved.

No mass lesion or midline shift. No hydrocephalus. No extra-axial
fluid collection.

Craniocervical junction within normal limits. Pituitary gland
unremarkable. No acute abnormality seen about the orbits. Minimal
opacity present within the left ethmoidal air cells. Paranasal
sinuses are otherwise clear. No mastoid effusion.

Bone marrow signal intensity within normal limits. Scalp soft
tissues unremarkable.

MRA HEAD FINDINGS

ANTERIOR CIRCULATION:

Visualized distal cervical segments of the internal carotid arteries
are widely patent with antegrade flow. The petrous, cavernous, and
supra clinoid segments are widely patent. A1 segments, anterior
communicating artery, and anterior cerebral arteries are well
opacified. M1 segments widely patent without stenosis or occlusion.
MCA bifurcations normal. Distal MCA branches well opacified and
symmetric in appearance bilaterally.

POSTERIOR CIRCULATION:

Vertebral arteries are well opacified to the vertebrobasilar
junction. Left posterior inferior cerebral artery normal. Right
posterior inferior cerebral artery not visualized.

Basilar artery widely patent. Superior cerebellar and anterior
inferior cerebral arteries are patent proximally. Posterior cerebral
arteries widely patent. Bilateral posterior communicating arteries
present, right larger than left.

No aneurysm.
IMPRESSION: MRI HEAD IMPRESSION:

1. 5 mm acute ischemic infarct within the left thalamus. No
associated hemorrhage or significant mass effect.
2. No other acute intracranial process identified.
3. Mild chronic small vessel ischemic disease.

MRA HEAD IMPRESSION:

Normal MRA of the intracranial circulation with no arterial
occlusion or hemodynamically significant stenosis.
